# Patient Record
Sex: Male | Born: 1935 | Race: White | Hispanic: No | Smoking: Former smoker
Health system: Southern US, Community
[De-identification: ages and names within clinical notes are randomized; demographics above are authoritative.]

## PROBLEM LIST (undated history)

## (undated) DIAGNOSIS — E039 Hypothyroidism, unspecified: Secondary | ICD-10-CM

## (undated) DIAGNOSIS — G629 Polyneuropathy, unspecified: Secondary | ICD-10-CM

## (undated) DIAGNOSIS — I6529 Occlusion and stenosis of unspecified carotid artery: Secondary | ICD-10-CM

## (undated) DIAGNOSIS — Z972 Presence of dental prosthetic device (complete) (partial): Secondary | ICD-10-CM

## (undated) DIAGNOSIS — E785 Hyperlipidemia, unspecified: Secondary | ICD-10-CM

## (undated) DIAGNOSIS — E119 Type 2 diabetes mellitus without complications: Secondary | ICD-10-CM

## (undated) DIAGNOSIS — D649 Anemia, unspecified: Secondary | ICD-10-CM

## (undated) DIAGNOSIS — I10 Essential (primary) hypertension: Secondary | ICD-10-CM

## (undated) DIAGNOSIS — I809 Phlebitis and thrombophlebitis of unspecified site: Secondary | ICD-10-CM

## (undated) HISTORY — DX: Occlusion and stenosis of unspecified carotid artery: I65.29

## (undated) HISTORY — DX: Hyperlipidemia, unspecified: E78.5

## (undated) HISTORY — PX: NASAL FRACTURE SURGERY: SHX718

## (undated) HISTORY — DX: Type 2 diabetes mellitus without complications: E11.9

## (undated) HISTORY — DX: Anemia, unspecified: D64.9

## (undated) HISTORY — DX: Essential (primary) hypertension: I10

---

## 1996-04-08 DIAGNOSIS — I809 Phlebitis and thrombophlebitis of unspecified site: Secondary | ICD-10-CM

## 1996-04-08 HISTORY — DX: Phlebitis and thrombophlebitis of unspecified site: I80.9

## 1996-04-08 HISTORY — PX: CAROTID ENDARTERECTOMY: SUR193

## 2003-04-09 HISTORY — PX: SIGMOIDOSCOPY: SUR1295

## 2009-07-07 ENCOUNTER — Ambulatory Visit: Payer: Self-pay | Admitting: Internal Medicine

## 2009-07-10 ENCOUNTER — Ambulatory Visit: Payer: Self-pay | Admitting: Internal Medicine

## 2009-08-06 ENCOUNTER — Ambulatory Visit: Payer: Self-pay | Admitting: Internal Medicine

## 2009-09-06 ENCOUNTER — Ambulatory Visit: Payer: Self-pay | Admitting: Internal Medicine

## 2009-10-06 ENCOUNTER — Ambulatory Visit: Payer: Self-pay | Admitting: Internal Medicine

## 2009-10-30 ENCOUNTER — Ambulatory Visit: Payer: Self-pay | Admitting: Internal Medicine

## 2009-11-06 ENCOUNTER — Ambulatory Visit: Payer: Self-pay | Admitting: Internal Medicine

## 2009-12-07 ENCOUNTER — Ambulatory Visit: Payer: Self-pay | Admitting: Internal Medicine

## 2010-01-29 ENCOUNTER — Ambulatory Visit: Payer: Self-pay | Admitting: Internal Medicine

## 2010-02-06 ENCOUNTER — Ambulatory Visit: Payer: Self-pay | Admitting: Internal Medicine

## 2010-04-08 ENCOUNTER — Ambulatory Visit: Payer: Self-pay | Admitting: Internal Medicine

## 2010-04-30 ENCOUNTER — Ambulatory Visit: Payer: Self-pay | Admitting: Internal Medicine

## 2010-05-09 ENCOUNTER — Ambulatory Visit: Payer: Self-pay | Admitting: Internal Medicine

## 2010-07-30 ENCOUNTER — Ambulatory Visit: Payer: Self-pay | Admitting: Internal Medicine

## 2010-08-07 ENCOUNTER — Ambulatory Visit: Payer: Self-pay | Admitting: Internal Medicine

## 2010-10-29 ENCOUNTER — Ambulatory Visit: Payer: Self-pay | Admitting: Internal Medicine

## 2010-11-07 ENCOUNTER — Ambulatory Visit: Payer: Self-pay | Admitting: Internal Medicine

## 2011-01-30 ENCOUNTER — Ambulatory Visit: Payer: Self-pay | Admitting: Internal Medicine

## 2011-02-07 ENCOUNTER — Ambulatory Visit: Payer: Self-pay | Admitting: Internal Medicine

## 2011-05-02 ENCOUNTER — Ambulatory Visit: Payer: Self-pay | Admitting: Internal Medicine

## 2011-05-02 LAB — CBC CANCER CENTER
Basophil #: 0 x10 3/mm (ref 0.0–0.1)
Basophil %: 0.2 %
Eosinophil #: 0.1 x10 3/mm (ref 0.0–0.7)
HGB: 12.5 g/dL — ABNORMAL LOW (ref 13.0–18.0)
Lymphocyte #: 1.4 x10 3/mm (ref 1.0–3.6)
Lymphocyte %: 19.4 %
MCH: 27.9 pg (ref 26.0–34.0)
MCV: 84 fL (ref 80–100)
Monocyte #: 0.5 x10 3/mm (ref 0.0–0.7)
Monocyte %: 6.6 %
Neutrophil #: 5.2 x10 3/mm (ref 1.4–6.5)
Neutrophil %: 72.9 %
Platelet: 102 x10 3/mm — ABNORMAL LOW (ref 150–440)
RBC: 4.49 10*6/uL (ref 4.40–5.90)
RDW: 15.1 % — ABNORMAL HIGH (ref 11.5–14.5)
WBC: 7.1 x10 3/mm (ref 3.8–10.6)

## 2011-05-10 ENCOUNTER — Ambulatory Visit: Payer: Self-pay | Admitting: Internal Medicine

## 2011-07-31 ENCOUNTER — Ambulatory Visit: Payer: Self-pay | Admitting: Internal Medicine

## 2011-07-31 LAB — CBC CANCER CENTER
Basophil #: 0.1 x10 3/mm (ref 0.0–0.1)
Eosinophil #: 0.1 x10 3/mm (ref 0.0–0.7)
Eosinophil %: 1 %
HCT: 38.5 % — ABNORMAL LOW (ref 40.0–52.0)
HGB: 12.4 g/dL — ABNORMAL LOW (ref 13.0–18.0)
MCH: 27.5 pg (ref 26.0–34.0)
Monocyte #: 0.4 x10 3/mm (ref 0.2–1.0)
Monocyte %: 6.2 %
Neutrophil #: 3.8 x10 3/mm (ref 1.4–6.5)
Neutrophil %: 62.5 %
Platelet: 109 x10 3/mm — ABNORMAL LOW (ref 150–440)
RDW: 15.7 % — ABNORMAL HIGH (ref 11.5–14.5)
WBC: 6.1 x10 3/mm (ref 3.8–10.6)

## 2011-08-07 ENCOUNTER — Ambulatory Visit: Payer: Self-pay | Admitting: Internal Medicine

## 2011-10-30 ENCOUNTER — Ambulatory Visit: Payer: Self-pay | Admitting: Internal Medicine

## 2011-10-30 LAB — CBC CANCER CENTER
Basophil #: 0.1 x10 3/mm (ref 0.0–0.1)
Eosinophil #: 0.1 x10 3/mm (ref 0.0–0.7)
HCT: 37.7 % — ABNORMAL LOW (ref 40.0–52.0)
Lymphocyte %: 27.5 %
MCH: 27.6 pg (ref 26.0–34.0)
MCHC: 32.3 g/dL (ref 32.0–36.0)
MCV: 85 fL (ref 80–100)
Monocyte #: 0.4 x10 3/mm (ref 0.2–1.0)
Monocyte %: 6.1 %
Neutrophil #: 4.2 x10 3/mm (ref 1.4–6.5)
Neutrophil %: 64.5 %
RDW: 15.2 % — ABNORMAL HIGH (ref 11.5–14.5)

## 2011-11-07 ENCOUNTER — Ambulatory Visit: Payer: Self-pay | Admitting: Internal Medicine

## 2012-02-06 ENCOUNTER — Ambulatory Visit: Payer: Self-pay | Admitting: Internal Medicine

## 2012-02-06 LAB — CBC CANCER CENTER
Basophil #: 0.1 x10 3/mm (ref 0.0–0.1)
Eosinophil #: 0 x10 3/mm (ref 0.0–0.7)
HCT: 35.7 % — ABNORMAL LOW (ref 40.0–52.0)
HGB: 12.1 g/dL — ABNORMAL LOW (ref 13.0–18.0)
Lymphocyte #: 1.3 x10 3/mm (ref 1.0–3.6)
MCH: 28.1 pg (ref 26.0–34.0)
MCHC: 34 g/dL (ref 32.0–36.0)
Monocyte #: 0.5 x10 3/mm (ref 0.2–1.0)
Neutrophil #: 3.1 x10 3/mm (ref 1.4–6.5)
Platelet: 69 x10 3/mm — ABNORMAL LOW (ref 150–440)
RDW: 14.8 % — ABNORMAL HIGH (ref 11.5–14.5)
WBC: 5 x10 3/mm (ref 3.8–10.6)

## 2012-02-07 ENCOUNTER — Ambulatory Visit: Payer: Self-pay | Admitting: Internal Medicine

## 2012-02-19 LAB — CBC CANCER CENTER
Basophil #: 0.1 x10 3/mm (ref 0.0–0.1)
Basophil %: 1.2 %
Eosinophil %: 0.8 %
HGB: 12.9 g/dL — ABNORMAL LOW (ref 13.0–18.0)
Lymphocyte #: 1.7 x10 3/mm (ref 1.0–3.6)
Lymphocyte %: 24.4 %
MCV: 87 fL (ref 80–100)
Monocyte #: 0.3 x10 3/mm (ref 0.2–1.0)
Monocyte %: 5 %
Neutrophil #: 4.8 x10 3/mm (ref 1.4–6.5)
Neutrophil %: 68.6 %
Platelet: 150 x10 3/mm (ref 150–440)
RBC: 4.7 10*6/uL (ref 4.40–5.90)
WBC: 7 x10 3/mm (ref 3.8–10.6)

## 2012-03-08 ENCOUNTER — Ambulatory Visit: Payer: Self-pay | Admitting: Internal Medicine

## 2012-04-08 ENCOUNTER — Ambulatory Visit: Payer: Self-pay | Admitting: Internal Medicine

## 2012-05-07 LAB — CBC CANCER CENTER
Eosinophil #: 0.1 x10 3/mm (ref 0.0–0.7)
HGB: 12.5 g/dL — ABNORMAL LOW (ref 13.0–18.0)
Lymphocyte #: 1.6 x10 3/mm (ref 1.0–3.6)
MCH: 28.1 pg (ref 26.0–34.0)
MCHC: 32.9 g/dL (ref 32.0–36.0)
Monocyte #: 0.3 x10 3/mm (ref 0.2–1.0)
Monocyte %: 5.1 %
Neutrophil #: 4.1 x10 3/mm (ref 1.4–6.5)
Platelet: 85 x10 3/mm — ABNORMAL LOW (ref 150–440)
RDW: 15.4 % — ABNORMAL HIGH (ref 11.5–14.5)
WBC: 6.1 x10 3/mm (ref 3.8–10.6)

## 2012-05-09 ENCOUNTER — Ambulatory Visit: Payer: Self-pay | Admitting: Internal Medicine

## 2012-06-23 ENCOUNTER — Ambulatory Visit: Payer: Self-pay | Admitting: Internal Medicine

## 2012-08-06 ENCOUNTER — Ambulatory Visit: Payer: Self-pay | Admitting: Internal Medicine

## 2012-08-06 LAB — CBC CANCER CENTER
Basophil #: 0.1 x10 3/mm (ref 0.0–0.1)
Basophil %: 0.8 %
Eosinophil #: 0.1 x10 3/mm (ref 0.0–0.7)
Lymphocyte #: 1.9 x10 3/mm (ref 1.0–3.6)
Lymphocyte %: 25.2 %
MCH: 27 pg (ref 26.0–34.0)
MCV: 84 fL (ref 80–100)
Monocyte #: 0.2 x10 3/mm (ref 0.2–1.0)
Monocyte %: 3.2 %
Neutrophil %: 69.9 %
RDW: 15.4 % — ABNORMAL HIGH (ref 11.5–14.5)

## 2012-09-06 ENCOUNTER — Ambulatory Visit: Payer: Self-pay | Admitting: Internal Medicine

## 2012-09-30 ENCOUNTER — Encounter: Payer: Self-pay | Admitting: *Deleted

## 2012-11-11 ENCOUNTER — Ambulatory Visit: Payer: Self-pay | Admitting: Internal Medicine

## 2012-11-12 LAB — CBC CANCER CENTER
Basophil #: 0.1 x10 3/mm (ref 0.0–0.1)
Eosinophil #: 0.1 x10 3/mm (ref 0.0–0.7)
Eosinophil %: 2 %
HCT: 31.8 % — ABNORMAL LOW (ref 40.0–52.0)
Lymphocyte #: 1.7 x10 3/mm (ref 1.0–3.6)
Lymphocyte %: 26.4 %
MCH: 28.2 pg (ref 26.0–34.0)
MCHC: 33.7 g/dL (ref 32.0–36.0)
MCV: 84 fL (ref 80–100)
Monocyte #: 0.4 x10 3/mm (ref 0.2–1.0)
Monocyte %: 6.3 %
Neutrophil #: 4 x10 3/mm (ref 1.4–6.5)
Neutrophil %: 64.1 %
RBC: 3.79 10*6/uL — ABNORMAL LOW (ref 4.40–5.90)
RDW: 15.8 % — ABNORMAL HIGH (ref 11.5–14.5)
WBC: 6.3 x10 3/mm (ref 3.8–10.6)

## 2012-12-07 ENCOUNTER — Ambulatory Visit: Payer: Self-pay | Admitting: Internal Medicine

## 2013-03-17 ENCOUNTER — Encounter: Payer: Self-pay | Admitting: General Surgery

## 2013-03-17 ENCOUNTER — Other Ambulatory Visit: Payer: Medicare Other

## 2013-03-17 ENCOUNTER — Ambulatory Visit: Payer: Self-pay | Admitting: General Surgery

## 2013-03-17 ENCOUNTER — Ambulatory Visit (INDEPENDENT_AMBULATORY_CARE_PROVIDER_SITE_OTHER): Payer: Medicare Other | Admitting: General Surgery

## 2013-03-17 VITALS — BP 130/60 | HR 70 | Resp 14 | Ht 69.0 in | Wt 162.0 lb

## 2013-03-17 DIAGNOSIS — I6529 Occlusion and stenosis of unspecified carotid artery: Secondary | ICD-10-CM

## 2013-03-17 NOTE — Patient Instructions (Signed)
Call for any symptoms of dizziness, sudden weakness or numbness or other symptoms suggestive of stroke or TIAs.-Pt aware opf these symptoms.

## 2013-03-17 NOTE — Progress Notes (Signed)
This is a 79 year old old male here today for a carotid ultrasound. Has history of a right endarterectomy . No neurological symptoms in past year. No new health issues. Doppler done today showing no changes.    Bilateral carotid Doppler study was performed today. The bifurcation was fairly high on both sides. The right external carotid was not well seen all. Only the proximal portions of the internal carotid seen bilaterally and good portion of the left external carotid. The endarterectomy site on the right appears to be clean with no sign of recurrent disease. There is mild intimal thickening noted on both sides. No plaque is identified on the left carotid the bifurcation. The vertebral arteries were difficult to visualize on either side. ICA to CCA ratio is 0.76 on the right and 0.46 on the left. Impression: Normal study with no evidence of recurrent disease in the right carotid.

## 2013-03-17 NOTE — Addendum Note (Signed)
Addended by: Volney Presser on: 03/17/2013 03:54 PM   Modules accepted: Orders

## 2014-03-17 ENCOUNTER — Ambulatory Visit (INDEPENDENT_AMBULATORY_CARE_PROVIDER_SITE_OTHER): Payer: Medicare Other | Admitting: General Surgery

## 2014-03-17 ENCOUNTER — Encounter: Payer: Self-pay | Admitting: General Surgery

## 2014-03-17 ENCOUNTER — Ambulatory Visit: Payer: Medicare Other

## 2014-03-17 VITALS — BP 130/70 | HR 70 | Resp 14 | Ht 69.0 in | Wt 168.0 lb

## 2014-03-17 DIAGNOSIS — I6529 Occlusion and stenosis of unspecified carotid artery: Secondary | ICD-10-CM

## 2014-03-17 NOTE — Progress Notes (Signed)
Patient ID: Corey Evans, male   DOB: 08/22/1927, 80 y.o.   MRN: [id number]   The patient presents for a 1 year follow up for a carotid doppler. He is doing well. No new complaints at this time.  He is alert and oriented. No focal neuro signs. Attempted Duplex study. Bifurcation is high and it was difficult to see the ICA and ECA on both sides. This is inadequate study.  Recommended  A CTA. Pt agreeable.  Patient has been scheduled for a [facility] Ne[facility] for Monday, 03-21-14 at 11 am (arrive 10:45 am). This patient has been instructed to hold Metformin day of scan and 48 hours after.

## 2014-03-17 NOTE — Patient Instructions (Addendum)
The patient is aware to call back for any questions or concerns.  Patient has been scheduled for a CT[facility] Neck WWO contrast [facility] for Monday, 03-21-14 at 11 am (arrive 10:45 am). This patient has been instructed to hold Metformin day of scan and 48 hours after.

## 2014-03-21 ENCOUNTER — Encounter: Payer: Self-pay | Admitting: General Surgery

## 2014-03-21 ENCOUNTER — Ambulatory Visit: Payer: Self-pay | Admitting: General Surgery

## 2015-03-21 ENCOUNTER — Encounter: Payer: Self-pay | Admitting: General Surgery

## 2015-03-21 ENCOUNTER — Ambulatory Visit: Payer: Medicare Other | Admitting: General Surgery

## 2015-03-21 ENCOUNTER — Other Ambulatory Visit (INDEPENDENT_AMBULATORY_CARE_PROVIDER_SITE_OTHER): Payer: Medicare Other

## 2015-03-21 VITALS — BP 142/64 | HR 84 | Resp 16 | Ht 69.0 in | Wt 172.0 lb

## 2015-03-21 DIAGNOSIS — I6529 Occlusion and stenosis of unspecified carotid artery: Secondary | ICD-10-CM | POA: Diagnosis not present

## 2015-03-21 NOTE — Patient Instructions (Addendum)
The patient is aware to call back for any questions or concerns. Follow up in one year with an office carotid ultrasound.

## 2015-03-21 NOTE — Progress Notes (Signed)
The patient presents for a 1 year follow up for a carotid doppler. He is doing well. No new complaints at this time.  He is alert and oriented. He is here today with his daughter. No focal neuro signs. I have reviewed the history of present illness with the patient.  Carotid doppler was completed today. Again he has very high bifurcation on both sides. Only the beginning if ICA is seen well on right and left side. On right no plaquing is identified.Left side with moderate plaquing in CCA,Bulb and proximal ICA. No apparent stenosis noted. CTA was done last yr showwing clean vessels on right and plaque with no stenosis on left. Current findings appear stable.     Follow up in one year with an office carotid ultrasound.   PCP:  Yates DecampWalker, Corey

## 2015-03-24 ENCOUNTER — Encounter: Payer: Self-pay | Admitting: General Surgery

## 2016-03-26 ENCOUNTER — Ambulatory Visit: Payer: Medicare Other | Admitting: General Surgery

## 2016-03-26 ENCOUNTER — Encounter: Payer: Self-pay | Admitting: *Deleted

## 2016-04-03 ENCOUNTER — Ambulatory Visit (INDEPENDENT_AMBULATORY_CARE_PROVIDER_SITE_OTHER): Payer: Medicare Other | Admitting: General Surgery

## 2016-04-03 ENCOUNTER — Other Ambulatory Visit: Payer: Self-pay

## 2016-04-03 ENCOUNTER — Encounter: Payer: Self-pay | Admitting: General Surgery

## 2016-04-03 VITALS — BP 154/70 | HR 82 | Resp 14 | Ht 69.0 in | Wt 171.0 lb

## 2016-04-03 DIAGNOSIS — I6523 Occlusion and stenosis of bilateral carotid arteries: Secondary | ICD-10-CM

## 2016-04-03 NOTE — Patient Instructions (Signed)
The patient is aware to call back for any questions or concerns.  

## 2016-04-03 NOTE — Progress Notes (Signed)
Patient ID: Corey Evans, male   DOB: 07/01/1927, 82 y.o.   MRN: [id number]  Chief Complaint  Patient presents with  . Follow-up    HPI Corey SergeWilliam Gerald Farruggia is a 82 y.o. male. presents for a 1 year follow up for a carotid doppler. He is doing well. No new complaints at this time.  He is alert and oriented. Denis any symptoms or lightheadednes. He is complaining of right knee pain followed by  Dr Dan HumphreysWalker.   I have reviewed the history of present illness with the patient.  HPI  Past Medical History:  Diagnosis Date  . Anemia   . Diabetes mellitus without complication (HCC)   . Hyperlipidemia   . Hypertension   . Occlusion and stenosis of carotid artery without mention of cerebral infarction     Past Surgical History:  Procedure Laterality Date  . CAROTID ENDARTERECTOMY Right 1998  . NASAL FRACTURE SURGERY  [id number]  . SIGMOIDOSCOPY  2005    No family history on file.  Social History Social History  Substance Use Topics  . Smoking status: Former Smoker    Packs/day: 2.00    Years: 30.00  . Smokeless tobacco: Never Used  . Alcohol use No    No Known Allergies  Current Outpatient Prescriptions  Medication Sig Dispense Refill  . amLODipine (NORVASC) 5 MG tablet Take 5 mg by mouth daily.    Marland Kitchen. aspirin 81 MG tablet Take 81 mg by mouth daily.    . cholecalciferol (VITAMIN D) 400 UNITS TABS tablet Take 400 Units by mouth.    Marland Kitchen. glimepiride (AMARYL) 4 MG tablet Take 1 tablet by mouth daily.    Marland Kitchen. losartan (COZAAR) 50 MG tablet Take 50 mg by mouth daily.    . metFORMIN (GLUCOPHAGE) 500 MG tablet Take by mouth 2 (two) times daily with a meal.    . metoprolol succinate (TOPROL-XL) 25 MG 24 hr tablet Take 1 tablet by mouth daily.    . Multiple Vitamin (MULTIVITAMIN) tablet Take 1 tablet by mouth daily.    . Omega-3 Fatty Acids (FISH OIL PO) Take 1 capsule by mouth daily.    . pioglitazone (ACTOS) 30 MG tablet Take 30 mg by mouth daily.     No current  facility-administered medications for this visit.     Review of Systems Review of Systems  Constitutional: Negative.   Respiratory: Negative.   Cardiovascular: Negative.     Blood pressure (!) 154/70, pulse 82, resp. rate 14, height 5\' 9"  (1.753 m), weight 171 lb (77.6 kg).  Physical Exam Physical Exam  Constitutional: He is oriented to person, place, and time. He appears well-developed and well-nourished.  HENT:  Mouth/Throat: Oropharynx is clear and moist.  Cardiovascular: Normal rate, regular rhythm and normal heart sounds.   Neurological: He is alert and oriented to person, place, and time.  Skin: Skin is warm and dry.  Psychiatric: His behavior is normal.    Data Reviewed prior notes Duplex carotid study completed today. As noted before he has very high bifurcations limiting evaluation of ICA and ECA. The right side remains clean post endarterectomy. Minimal plaquing noted in left CCA and bifurcation area with no apparent stenosis Assessment    Stable mild plaquing on left carotid without any stenosis. CTA was done 2015 showing clean vessels on right and plaque with no stenosis on left.     Plan   He has remained stable for several yrs now and given his age will not schedule routine yrly  carotid doppler.  He is aware of symptoms of TIA/stroke like events. Follow up as needed. The patient is aware to call back for any questions or concerns.     This information has been scribed by Dorathy DaftMarsha Hatch RN, BSN,BC.    Sullivan Jacuinde G 04/05/2016, 9:30 AM

## 2016-05-30 ENCOUNTER — Other Ambulatory Visit
Admission: RE | Admit: 2016-05-30 | Discharge: 2016-05-30 | Disposition: A | Discharge status: Home / Self Care | Payer: Medicare Other | Source: Ambulatory Visit | Attending: Ophthalmology | Admitting: Ophthalmology

## 2016-05-30 DIAGNOSIS — H47012 Ischemic optic neuropathy, left eye: Secondary | ICD-10-CM | POA: Insufficient documentation

## 2016-05-30 LAB — C-REACTIVE PROTEIN: CRP: <0.8 mg/dL (ref <1.0)

## 2016-05-30 LAB — PLATELET COUNT: PLATELETS: 113 10*3/uL — AB (ref 150–440)

## 2016-05-30 LAB — SEDIMENTATION RATE: SED RATE: 13 mm/h (ref 0–20)

## 2016-08-07 ENCOUNTER — Encounter: Payer: Self-pay | Admitting: *Deleted

## 2016-08-08 NOTE — Discharge Instructions (Signed)
Cataract Surgery, Care After °Refer to this sheet in the next few weeks. These instructions provide you with information about caring for yourself after your procedure. Your health care provider may also give you more specific instructions. Your treatment has been planned according to current medical practices, but problems sometimes occur. Call your health care provider if you have any problems or questions after your procedure. °What can I expect after the procedure? °After the procedure, it is common to have: °· Itching. °· Discomfort. °· Fluid discharge. °· Sensitivity to light and to touch. °· Bruising. °Follow these instructions at home: °Eye Care  °· Check your eye every day for signs of infection. Watch for: °¨ Redness, swelling, or pain. °¨ Fluid, blood, or pus. °¨ Warmth. °¨ Bad smell. °Activity  °· Avoid strenuous activities, such as playing contact sports, for as long as told by your health care provider. °· Do not drive or operate heavy machinery until your health care provider approves. °· Do not bend or lift heavy objects . Bending increases pressure in the eye. You can walk, climb stairs, and do light household chores. °· Ask your health care provider when you can return to work. If you work in a dusty environment, you may be advised to wear protective eyewear for a period of time. °General instructions  °· Take or apply over-the-counter and prescription medicines only as told by your health care provider. This includes eye drops. °· Do not touch or rub your eyes. °· If you were given a protective shield, wear it as told by your health care provider. If you were not given a protective shield, wear sunglasses as told by your health care provider to protect your eyes. °· Keep the area around your eye clean and dry. Avoid swimming or allowing water to hit you directly in the face while showering until told by your health care provider. Keep soap and shampoo out of your eyes. °· Do not put a contact lens  into the affected eye or eyes until your health care provider approves. °· Keep all follow-up visits as told by your health care provider. This is important. °Contact a health care provider if: ° °· You have increased bruising around your eye. °· You have pain that is not helped with medicine. °· You have a fever. °· You have redness, swelling, or pain in your eye. °· You have fluid, blood, or pus coming from your incision. °· Your vision gets worse. °Get help right away if: °· You have sudden vision loss. °This information is not intended to replace advice given to you by your health care provider. Make sure you discuss any questions you have with your health care provider. °Document Released: 10/12/2004 Document Revised: 08/03/2015 Document Reviewed: 02/02/2015 °Elsevier Interactive Patient Education © 2017 [employer] ° ° ° ° °General Anesthesia, Adult, Care After °These instructions provide you with information about caring for yourself after your procedure. Your health care provider may also give you more specific instructions. Your treatment has been planned according to current medical practices, but problems sometimes occur. Call your health care provider if you have any problems or questions after your procedure. °What can I expect after the procedure? °After the procedure, it is common to have: °· Vomiting. °· A sore throat. °· Mental slowness. °It is common to feel: °· Nauseous. °· Cold or shivery. °· Sleepy. °· Tired. °· Sore or achy, even in parts of your body where you did not have surgery. °Follow these instructions at   home: °For at least 24 hours after the procedure:  °· Do not: °¨ Participate in activities where you could fall or become injured. °¨ Drive. °¨ Use heavy machinery. °¨ Drink alcohol. °¨ Take sleeping pills or medicines that cause drowsiness. °¨ Make important decisions or sign legal documents. °¨ Take care of children on your own. °· Rest. °Eating and drinking  °· If you vomit, drink  water, juice, or soup when you can drink without vomiting. °· Drink enough fluid to keep your urine clear or pale yellow. °· Make sure you have little or no nausea before eating solid foods. °· Follow the diet recommended by your health care provider. °General instructions  °· Have a responsible adult stay with you until you are awake and alert. °· Return to your normal activities as told by your health care provider. Ask your health care provider what activities are safe for you. °· Take over-the-counter and prescription medicines only as told by your health care provider. °· If you smoke, do not smoke without supervision. °· Keep all follow-up visits as told by your health care provider. This is important. °Contact a health care provider if: °· You continue to have nausea or vomiting at home, and medicines are not helpful. °· You cannot drink fluids or start eating again. °· You cannot urinate after 8-12 hours. °· You develop a skin rash. °· You have fever. °· You have increasing redness at the site of your procedure. °Get help right away if: °· You have difficulty breathing. °· You have chest pain. °· You have unexpected bleeding. °· You feel that you are having a life-threatening or urgent problem. °This information is not intended to replace advice given to you by your health care provider. Make sure you discuss any questions you have with your health care provider. °Document Released: 07/01/2000 Document Revised: 08/28/2015 Document Reviewed: 03/09/2015 °Elsevier Interactive Patient Education © 2017 [employer] Inc[employer] ° °

## 2016-08-14 ENCOUNTER — Ambulatory Visit
Admission: RE | Admit: 2016-08-14 | Discharge: 2016-08-14 | Disposition: A | Discharge status: Home / Self Care | Payer: Medicare Other | Source: Ambulatory Visit | Attending: Ophthalmology | Admitting: Ophthalmology

## 2016-08-14 ENCOUNTER — Ambulatory Visit: Payer: Medicare Other | Admitting: Anesthesiology

## 2016-08-14 ENCOUNTER — Encounter
Admission: RE | Disposition: A | Discharge status: Home / Self Care | Payer: Self-pay | Source: Ambulatory Visit | Attending: Ophthalmology

## 2016-08-14 DIAGNOSIS — Z87891 Personal history of nicotine dependence: Secondary | ICD-10-CM | POA: Insufficient documentation

## 2016-08-14 DIAGNOSIS — E1136 Type 2 diabetes mellitus with diabetic cataract: Secondary | ICD-10-CM | POA: Diagnosis present

## 2016-08-14 DIAGNOSIS — I739 Peripheral vascular disease, unspecified: Secondary | ICD-10-CM | POA: Diagnosis not present

## 2016-08-14 DIAGNOSIS — I10 Essential (primary) hypertension: Secondary | ICD-10-CM | POA: Diagnosis not present

## 2016-08-14 DIAGNOSIS — H5703 Miosis: Secondary | ICD-10-CM | POA: Diagnosis not present

## 2016-08-14 HISTORY — DX: Phlebitis and thrombophlebitis of unspecified site: I80.9

## 2016-08-14 HISTORY — PX: CATARACT EXTRACTION W/PHACO: SHX586

## 2016-08-14 HISTORY — DX: Presence of dental prosthetic device (complete) (partial): Z97.2

## 2016-08-14 HISTORY — DX: Polyneuropathy, unspecified: G62.9

## 2016-08-14 LAB — GLUCOSE, CAPILLARY
GLUCOSE-CAPILLARY: 158 mg/dL — AB (ref 65–99)
Glucose-Capillary: 148 mg/dL — ABNORMAL HIGH (ref 65–99)

## 2016-08-14 SURGERY — PHACOEMULSIFICATION, CATARACT, WITH IOL INSERTION
Anesthesia: Monitor Anesthesia Care | Site: Eye | Laterality: Right | Wound class: Clean

## 2016-08-14 MED ORDER — FENTANYL CITRATE (PF) 100 MCG/2ML IJ SOLN
INTRAMUSCULAR | Status: DC | PRN
  Administered 2016-08-14: 50 ug via INTRAVENOUS

## 2016-08-14 MED ORDER — ARMC OPHTHALMIC DILATING DROPS
1.0000 "application " | OPHTHALMIC | Status: DC | PRN
  Administered 2016-08-14 (×3): 1 via OPHTHALMIC

## 2016-08-14 MED ORDER — LIDOCAINE HCL (PF) 2 % IJ SOLN
INTRAOCULAR | Status: DC | PRN
  Administered 2016-08-14: 1 mL via INTRAOCULAR

## 2016-08-14 MED ORDER — EPINEPHRINE PF 1 MG/ML IJ SOLN
INTRAOCULAR | Status: DC | PRN
  Administered 2016-08-14: 135 mL via OPHTHALMIC

## 2016-08-14 MED ORDER — MIDAZOLAM HCL 2 MG/2ML IJ SOLN
INTRAMUSCULAR | Status: DC | PRN
  Administered 2016-08-14: 1.5 mg via INTRAVENOUS

## 2016-08-14 MED ORDER — ACETAMINOPHEN 160 MG/5ML PO SOLN: 325.0000 mg | ORAL | Status: DC | PRN

## 2016-08-14 MED ORDER — BRIMONIDINE TARTRATE-TIMOLOL 0.2-0.5 % OP SOLN
OPHTHALMIC | Status: DC | PRN
  Administered 2016-08-14: 1 [drp] via OPHTHALMIC

## 2016-08-14 MED ORDER — MOXIFLOXACIN HCL 0.5 % OP SOLN
1.0000 [drp] | OPHTHALMIC | Status: DC | PRN
  Administered 2016-08-14 (×3): 1 [drp] via OPHTHALMIC

## 2016-08-14 MED ORDER — NA HYALUR & NA CHOND-NA HYALUR 0.4-0.35 ML IO KIT
PACK | INTRAOCULAR | Status: DC | PRN
  Administered 2016-08-14: 1 mL via INTRAOCULAR

## 2016-08-14 MED ORDER — CEFUROXIME OPHTHALMIC INJECTION 1 MG/0.1 ML
INJECTION | OPHTHALMIC | Status: DC | PRN
  Administered 2016-08-14: 0.1 mL via OPHTHALMIC

## 2016-08-14 MED ORDER — LACTATED RINGERS IV SOLN: INTRAVENOUS | Status: DC

## 2016-08-14 MED ORDER — ACETAMINOPHEN 325 MG PO TABS: 325.0000 mg | ORAL_TABLET | ORAL | Status: DC | PRN

## 2016-08-14 MED ORDER — ERYTHROMYCIN 5 MG/GM OP OINT
TOPICAL_OINTMENT | OPHTHALMIC | Status: DC | PRN
  Administered 2016-08-14: 1 via OPHTHALMIC

## 2016-08-14 SURGICAL SUPPLY — 25 items
CANNULA ANT/CHMB 27GA (MISCELLANEOUS) ×3 IMPLANT
CARTRIDGE ABBOTT (MISCELLANEOUS) IMPLANT
GLOVE SURG LX 7.5 STRW (GLOVE) ×4
GLOVE SURG LX STRL 7.5 STRW (GLOVE) ×2 IMPLANT
GLOVE SURG TRIUMPH 8.0 PF LTX (GLOVE) ×3 IMPLANT
GOWN STRL REUS W/ TWL LRG LVL3 (GOWN DISPOSABLE) ×2 IMPLANT
GOWN STRL REUS W/TWL LRG LVL3 (GOWN DISPOSABLE) ×4
LENS IOL TECNIS ITEC 19.0 (Intraocular Lens) ×3 IMPLANT
MARKER SKIN DUAL TIP RULER LAB (MISCELLANEOUS) ×3 IMPLANT
NDL RETROBULBAR .5 NSTRL (NEEDLE) IMPLANT
NEEDLE FILTER BLUNT 18X 1/2SAF (NEEDLE) ×4
NEEDLE FILTER BLUNT 18X1 1/2 (NEEDLE) ×2 IMPLANT
PACK CATARACT BRASINGTON (MISCELLANEOUS) ×3 IMPLANT
PACK EYE AFTER SURG (MISCELLANEOUS) ×3 IMPLANT
PACK OPTHALMIC (MISCELLANEOUS) ×3 IMPLANT
RING MALYGIN 7.0 (MISCELLANEOUS) ×3 IMPLANT
SUT ETHILON 10-0 CS-B-6CS-B-6 (SUTURE)
SUT VICRYL  9 0 (SUTURE)
SUT VICRYL 9 0 (SUTURE) IMPLANT
SUTURE EHLN 10-0 CS-B-6CS-B-6 (SUTURE) IMPLANT
SYR 3ML LL SCALE MARK (SYRINGE) ×6 IMPLANT
SYR 5ML LL (SYRINGE) ×3 IMPLANT
SYR TB 1ML LUER SLIP (SYRINGE) ×3 IMPLANT
WATER STERILE IRR 250ML POUR (IV SOLUTION) ×3 IMPLANT
WIPE NON LINTING 3.25X3.25 (MISCELLANEOUS) ×3 IMPLANT

## 2016-08-14 NOTE — Anesthesia Preprocedure Evaluation (Signed)
Anesthesia Evaluation  Patient identified by MRN, date of birth, ID band Patient awake    Reviewed: Allergy & Precautions, H&P , NPO status , Patient's Chart, lab work & pertinent test results, reviewed documented beta blocker date and time   Airway Mallampati: III  TM Distance: >3 FB Neck ROM: full    Dental  (+) Upper Dentures, Lower Dentures   Pulmonary neg pulmonary ROS, former smoker,    Pulmonary exam normal breath sounds clear to auscultation       Cardiovascular Exercise Tolerance: Good hypertension, + Peripheral Vascular Disease  negative cardio ROS Normal cardiovascular exam Rhythm:regular Rate:Normal     Neuro/Psych negative neurological ROS  negative psych ROS   GI/Hepatic negative GI ROS, Neg liver ROS,   Endo/Other  negative endocrine ROSdiabetes  Renal/GU negative Renal ROS  negative genitourinary   Musculoskeletal   Abdominal   Peds  Hematology negative hematology ROS (+)   Anesthesia Other Findings   Reproductive/Obstetrics negative OB ROS                             Anesthesia Physical Anesthesia Plan  ASA: III  Anesthesia Plan: MAC   Post-op Pain Management:    Induction:   Airway Management Planned:   Additional Equipment:   Intra-op Plan:   Post-operative Plan:   Informed Consent: I have reviewed the patients History and Physical, chart, labs and discussed the procedure including the risks, benefits and alternatives for the proposed anesthesia with the patient or authorized representative who has indicated his/her understanding and acceptance.   Dental Advisory Given  Plan Discussed with: CRNA and Anesthesiologist  Anesthesia Plan Comments:         Anesthesia Quick Evaluation

## 2016-08-14 NOTE — Op Note (Signed)
OPERATIVE NOTE  Corey Evans [id number] 08/14/2016   PREOIsaiah SergeERATIVE DIAGNOSIS:    Nuclear Sclerotic Cataract Right eye with miotic pupil.        H25.11  POSTOPERATIVE DIAGNOSIS: Nuclear Sclerotic Cataract Right eye with miotic pupil.          PROCEDURE:  Phacoemusification with posterior chamber intraocular lens placement of the right eye which required pupil stretching with the Malyugin pupil expansion device.  LENS:   Implant Name Type Inv. Item Serial No. Manufacturer Lot No. LRB No. Used  LENS IOL DIOP 19.0 - [id number] Intraocular Lens LENS IOL DIOP 19.0 [id number] AMO   Right 1       ULTRASOUND TIME: 21 % of 3 minutes 25 seconds, CDE 43.6  SURGEON:  Deirdre Evenerhadwick R. Leelynd Maldonado, MD   ANESTHESIA:  Topical with tetracaine drops and 2% Xylocaine jelly, augmented with 1% preservative-free intracameral lidocaine.   COMPLICATIONS:  None.   DESCRIPTION OF PROCEDURE:  The patient was identified in the holding room and transported to the operating room and placed in the supine position under the operating microscope. Theright eye was identified as the operative eye and it was prepped and draped in the usual sterile ophthalmic fashion.   A 1 millimeter clear-corneal paracentesis was made at the 12:00 position.  0.5 ml of preservative-free 1% lidocaine was injected into the anterior chamber. The anterior chamber was filled with Viscoat viscoelastic.  A 2.4 millimeter keratome was used to make a near-clear corneal incision at the 9:00 position. A Malyugin pupil expander was then placed through the main incision and into the anterior chamber of the eye.  The edge of the iris was secured on the lip of the pupil expander and it was released, thereby expanding the pupil to approximately 8 millimeters for completion of the cataract surgery.  Additional Viscoat was placed in the anterior chamber.  A cystotome and capsulorrhexis forceps were used to make a curvilinear capsulorrhexis.   Balanced salt  solution was used to hydrodissect and hydrodelineate the lens nucleus.   Phacoemulsification was used in stop and chop fashion to remove the lens, nucleus and epinucleus.  The remaining cortex was aspirated using the irrigation aspiration handpiece.  Additional Provisc was placed into the eye to distend the capsular bag for lens placement.  A lens was then injected into the capsular bag.  The pupil expanding ring was removed using a Kuglen hook and insertion device. The remaining viscoelastic was aspirated from the capsular bag and the anterior chamber.  The anterior chamber was filled with balanced salt solution to inflate to a physiologic pressure.  Wounds were hydrated with balanced salt solution.  The anterior chamber was inflated to a physiologic pressure with balanced salt solution.  No wound leaks were noted.Cefuroxime 0.1 ml of a 10mg /ml solution was injected into the anterior chamber for a dose of 1 mg of intracameral antibiotic at the completion of the case. Timolol and Brimonidine drops and erythromycin ointment were applied to the eye.  The patient was taken to the recovery room in stable condition without complications of anesthesia or surgery.  Aerion Bagdasarian 08/14/2016, 8:47 AM

## 2016-08-14 NOTE — H&P (Signed)
The History and Physical notes are on paper, have been signed, and are to be scanned. The patient remains stable and unchanged from the H&P.   Previous H&P reviewed, patient examined, and there are no changes.  Corey Evans 08/14/2016 7:45 AM   

## 2016-08-14 NOTE — Anesthesia Procedure Notes (Signed)
Procedure Name: MAC Performed by: Mayme Genta Pre-anesthesia Checklist: Patient identified, Emergency Drugs available, Suction available, Timeout performed and Patient being monitored Patient Re-evaluated:Patient Re-evaluated prior to inductionOxygen Delivery Method: Nasal cannula Placement Confirmation: positive ETCO2

## 2016-08-14 NOTE — Transfer of Care (Signed)
Immediate Anesthesia Transfer of Care Note  Patient: Corey Evans  Procedure(s) Performed: Procedure(s) with comments: CATARACT EXTRACTION PHACO AND INTRAOCULAR LENS PLACEMENT (IOC)  Complicated Right Diabetic (Right) - Diabetic - oral meds Malyugin  Patient Location: PACU  Anesthesia Type: MAC  Level of Consciousness: awake, alert  and patient cooperative  Airway and Oxygen Therapy: Patient Spontanous Breathing and Patient connected to supplemental oxygen  Post-op Assessment: Post-op Vital signs reviewed, Patient's Cardiovascular Status Stable, Respiratory Function Stable, Patent Airway and No signs of Nausea or vomiting  Post-op Vital Signs: Reviewed and stable  Complications: No apparent anesthesia complications

## 2016-08-14 NOTE — Anesthesia Postprocedure Evaluation (Signed)
Anesthesia Post Note  Patient: Corey Evans  Procedure(s) Performed: Procedure(s) (LRB): CATARACT EXTRACTION PHACO AND INTRAOCULAR LENS PLACEMENT (IOC)  Complicated Right Diabetic (Right)  Patient location during evaluation: PACU Anesthesia Type: MAC Level of consciousness: awake and alert Pain management: pain level controlled Vital Signs Assessment: post-procedure vital signs reviewed and stable Respiratory status: spontaneous breathing, nonlabored ventilation, respiratory function stable and patient connected to nasal cannula oxygen Cardiovascular status: stable and blood pressure returned to baseline Anesthetic complications: no    Trecia Rogers

## 2016-08-15 ENCOUNTER — Encounter: Payer: Self-pay | Admitting: Ophthalmology

## 2017-03-12 ENCOUNTER — Encounter: Payer: Self-pay | Admitting: *Deleted

## 2017-03-12 ENCOUNTER — Other Ambulatory Visit: Payer: Self-pay

## 2017-03-19 NOTE — Discharge Instructions (Signed)

## 2017-03-24 ENCOUNTER — Ambulatory Visit: Payer: Medicare Other | Admitting: Anesthesiology

## 2017-03-24 ENCOUNTER — Encounter
Admission: RE | Disposition: A | Discharge status: Home / Self Care | Payer: Self-pay | Source: Ambulatory Visit | Attending: Ophthalmology

## 2017-03-24 ENCOUNTER — Ambulatory Visit
Admission: RE | Admit: 2017-03-24 | Discharge: 2017-03-24 | Disposition: A | Discharge status: Home / Self Care | Payer: Medicare Other | Source: Ambulatory Visit | Attending: Ophthalmology | Admitting: Ophthalmology

## 2017-03-24 DIAGNOSIS — E1136 Type 2 diabetes mellitus with diabetic cataract: Secondary | ICD-10-CM | POA: Diagnosis present

## 2017-03-24 DIAGNOSIS — Z87891 Personal history of nicotine dependence: Secondary | ICD-10-CM | POA: Insufficient documentation

## 2017-03-24 DIAGNOSIS — D649 Anemia, unspecified: Secondary | ICD-10-CM | POA: Diagnosis not present

## 2017-03-24 DIAGNOSIS — I10 Essential (primary) hypertension: Secondary | ICD-10-CM | POA: Insufficient documentation

## 2017-03-24 DIAGNOSIS — N182 Chronic kidney disease, stage 2 (mild): Secondary | ICD-10-CM | POA: Diagnosis not present

## 2017-03-24 DIAGNOSIS — I739 Peripheral vascular disease, unspecified: Secondary | ICD-10-CM | POA: Diagnosis not present

## 2017-03-24 DIAGNOSIS — K219 Gastro-esophageal reflux disease without esophagitis: Secondary | ICD-10-CM | POA: Insufficient documentation

## 2017-03-24 HISTORY — DX: Hypothyroidism, unspecified: E03.9

## 2017-03-24 HISTORY — PX: CATARACT EXTRACTION W/PHACO: SHX586

## 2017-03-24 LAB — GLUCOSE, CAPILLARY
Glucose-Capillary: 81 mg/dL (ref 65–99)
Glucose-Capillary: 84 mg/dL (ref 65–99)

## 2017-03-24 SURGERY — PHACOEMULSIFICATION, CATARACT, WITH IOL INSERTION
Anesthesia: Topical | Laterality: Left | Wound class: Clean

## 2017-03-24 MED ORDER — BRIMONIDINE TARTRATE-TIMOLOL 0.2-0.5 % OP SOLN
OPHTHALMIC | Status: DC | PRN
  Administered 2017-03-24: 1 [drp] via OPHTHALMIC

## 2017-03-24 MED ORDER — LACTATED RINGERS IV SOLN: INTRAVENOUS | Status: DC

## 2017-03-24 MED ORDER — ALFENTANIL 500 MCG/ML IJ INJ
INJECTION | INTRAVENOUS | Status: DC | PRN
  Administered 2017-03-24 (×2): 250 ug via INTRAVENOUS

## 2017-03-24 MED ORDER — EPINEPHRINE PF 1 MG/ML IJ SOLN
INTRAMUSCULAR | Status: DC | PRN
  Administered 2017-03-24: 85 mL via OPHTHALMIC

## 2017-03-24 MED ORDER — MOXIFLOXACIN HCL 0.5 % OP SOLN
1.0000 [drp] | OPHTHALMIC | Status: DC | PRN
  Administered 2017-03-24 (×3): 1 [drp] via OPHTHALMIC

## 2017-03-24 MED ORDER — BALANCED SALT IO SOLN
INTRAOCULAR | Status: DC | PRN
  Administered 2017-03-24: 1 mL via INTRAMUSCULAR

## 2017-03-24 MED ORDER — CEFUROXIME OPHTHALMIC INJECTION 1 MG/0.1 ML
INJECTION | OPHTHALMIC | Status: DC | PRN
  Administered 2017-03-24: 0.1 mL via INTRACAMERAL

## 2017-03-24 MED ORDER — OXYCODONE HCL 5 MG PO TABS: 5.0000 mg | ORAL_TABLET | Freq: Once | ORAL | Status: DC | PRN

## 2017-03-24 MED ORDER — ARMC OPHTHALMIC DILATING DROPS
1.0000 "application " | OPHTHALMIC | Status: DC | PRN
  Administered 2017-03-24 (×3): 1 via OPHTHALMIC

## 2017-03-24 MED ORDER — MIDAZOLAM HCL 2 MG/2ML IJ SOLN
INTRAMUSCULAR | Status: DC | PRN
  Administered 2017-03-24: 1 mg via INTRAVENOUS

## 2017-03-24 MED ORDER — OXYCODONE HCL 5 MG/5ML PO SOLN: 5.0000 mg | Freq: Once | ORAL | Status: DC | PRN

## 2017-03-24 MED ORDER — LACTATED RINGERS IV SOLN: 500.0000 mL | INTRAVENOUS | Status: DC

## 2017-03-24 MED ORDER — NA HYALUR & NA CHOND-NA HYALUR 0.4-0.35 ML IO KIT
PACK | INTRAOCULAR | Status: DC | PRN
  Administered 2017-03-24: 1 mL via INTRAOCULAR

## 2017-03-24 SURGICAL SUPPLY — 25 items
CANNULA ANT/CHMB 27GA (MISCELLANEOUS) ×3 IMPLANT
CARTRIDGE ABBOTT (MISCELLANEOUS) IMPLANT
GLOVE SURG LX 7.5 STRW (GLOVE) ×2
GLOVE SURG LX STRL 7.5 STRW (GLOVE) ×1 IMPLANT
GLOVE SURG TRIUMPH 8.0 PF LTX (GLOVE) ×3 IMPLANT
GOWN STRL REUS W/ TWL LRG LVL3 (GOWN DISPOSABLE) ×2 IMPLANT
GOWN STRL REUS W/TWL LRG LVL3 (GOWN DISPOSABLE) ×4
LENS IOL TECNIS ITEC 19.5 (Intraocular Lens) ×3 IMPLANT
MARKER SKIN DUAL TIP RULER LAB (MISCELLANEOUS) ×3 IMPLANT
NDL RETROBULBAR .5 NSTRL (NEEDLE) IMPLANT
NEEDLE FILTER BLUNT 18X 1/2SAF (NEEDLE) ×2
NEEDLE FILTER BLUNT 18X1 1/2 (NEEDLE) ×1 IMPLANT
PACK CATARACT BRASINGTON (MISCELLANEOUS) ×3 IMPLANT
PACK EYE AFTER SURG (MISCELLANEOUS) ×3 IMPLANT
PACK OPTHALMIC (MISCELLANEOUS) ×3 IMPLANT
RING MALYGIN 7.0 (MISCELLANEOUS) ×3 IMPLANT
SUT ETHILON 10-0 CS-B-6CS-B-6 (SUTURE)
SUT VICRYL  9 0 (SUTURE)
SUT VICRYL 9 0 (SUTURE) IMPLANT
SUTURE EHLN 10-0 CS-B-6CS-B-6 (SUTURE) IMPLANT
SYR 3ML LL SCALE MARK (SYRINGE) ×3 IMPLANT
SYR 5ML LL (SYRINGE) ×3 IMPLANT
SYR TB 1ML LUER SLIP (SYRINGE) ×3 IMPLANT
WATER STERILE IRR 250ML POUR (IV SOLUTION) ×3 IMPLANT
WIPE NON LINTING 3.25X3.25 (MISCELLANEOUS) ×3 IMPLANT

## 2017-03-24 NOTE — Op Note (Signed)
OPERATIVE NOTE  Isaiah SergeWilliam Gerald Catalano [id number] 03/24/2017  PREOPERATIVE DIAGNOSIS:   Nuclear sclerotic cataract left eye with miotic pupil      H25.12   POSTOPERATIVE DIAGNOSIS:   Nuclear sclerotic cataract left eye with miotic pupil.     PROCEDURE:  Phacoemulsification with posterior chamber intraocular lens implantation of the left eye which required pupil stretching with the Malyugin pupil expansion device   LENS:   Implant Name Type Inv. Item Serial No. Manufacturer Lot No. LRB No. Used  LENS IOL DIOP 19.5 - [id number] Intraocular Lens LENS IOL DIOP 19.5 [id number]  Left 1        ULTRASOUND TIME: 24 % of 2 minutes, 25 seconds.  CDE 34.5   SURGEON:  Deirdre Evenerhadwick R. Lan Entsminger, MD   ANESTHESIA: Topical with tetracaine drops and 2% Xylocaine jelly, augmented with 1% preservative-free intracameral lidocaine.   COMPLICATIONS:  None.   DESCRIPTION OF PROCEDURE:  The patient was identified in the holding room and transported to the operating room and placed in the supine position under the operating microscope.  The left eye was identified as the operative eye and it was prepped and draped in the usual sterile ophthalmic fashion.   A 1 millimeter clear-corneal paracentesis was made at the 1:30 position.  The anterior chamber was filled with Viscoat viscoelastic.  0.5 ml of preservative-free 1% lidocaine was injected into the anterior chamber.  A 2.4 millimeter keratome was used to make a near-clear corneal incision at the 10:30 position.  A Malyugin pupil expander was then placed through the main incision and into the anterior chamber of the eye.  The edge of the iris was secured on the lip of the pupil expander and it was released, thereby expanding the pupil to approximately 8 millimeters for completion of the cataract surgery.  Additional Viscoat was placed in the anterior chamber.  A cystotome and capsulorrhexis forceps were used to make a curvilinear capsulorrhexis.   Balanced salt  solution was used to hydrodissect and hydrodelineate the lens nucleus.   Phacoemulsification was used in stop and chop fashion to remove the lens, nucleus and epinucleus.  The remaining cortex was aspirated using the irrigation aspiration handpiece.  Additional Provisc was placed into the eye to distend the capsular bag for lens placement.  A lens was then injected into the capsular bag.  The pupil expanding ring was removed using a Kuglen hook and insertion device. The remaining viscoelastic was aspirated from the capsular bag and the anterior chamber.  The anterior chamber was filled with balanced salt solution to inflate to a physiologic pressure.   Wounds were hydrated with balanced salt solution.  The anterior chamber was inflated to a physiologic pressure with balanced salt solution.  No wound leaks were noted. Cefuroxime 0.1 ml of a 10mg /ml solution was injected into the anterior chamber for a dose of 1 mg of intracameral antibiotic at the completion of the case.   Timolol and Brimonidine drops were applied to the eye.  The patient was taken to the recovery room in stable condition without complications of anesthesia or surgery.  Francesca Strome 03/24/2017, 8:40 AM

## 2017-03-24 NOTE — Anesthesia Postprocedure Evaluation (Signed)
Anesthesia Post Note  Patient: Corey Evans  Procedure(s) Performed: CATARACT EXTRACTION PHACO AND INTRAOCULAR LENS PLACEMENT (IOC) COMPLICATED LEFT DIABETIC (Left )  Patient location during evaluation: PACU Anesthesia Type: MAC Level of consciousness: awake and alert Pain management: pain level controlled Vital Signs Assessment: post-procedure vital signs reviewed and stable Respiratory status: spontaneous breathing, nonlabored ventilation, respiratory function stable and patient connected to nasal cannula oxygen Cardiovascular status: stable and blood pressure returned to baseline Postop Assessment: no apparent nausea or vomiting Anesthetic complications: no    Angelie Kram

## 2017-03-24 NOTE — H&P (Signed)
The History and Physical notes are on paper, have been signed, and are to be scanned. The patient remains stable and unchanged from the H&P.   Previous H&P reviewed, patient examined, and there are no changes.  Corey Evans 03/24/2017 7:41 AM

## 2017-03-24 NOTE — Anesthesia Procedure Notes (Signed)
Procedure Name: MAC Date/Time: 03/24/2017 8:20 AM Performed by: Cameron Ali, CRNA Pre-anesthesia Checklist: Patient identified, Emergency Drugs available, Suction available, Timeout performed and Patient being monitored Patient Re-evaluated:Patient Re-evaluated prior to induction Oxygen Delivery Method: Nasal cannula Placement Confirmation: positive ETCO2

## 2017-03-24 NOTE — Transfer of Care (Signed)
Immediate Anesthesia Transfer of Care Note  Patient: Corey Evans  Procedure(s) Performed: CATARACT EXTRACTION PHACO AND INTRAOCULAR LENS PLACEMENT (IOC) COMPLICATED LEFT DIABETIC (Left )  Patient Location: PACU  Anesthesia Type: No value filed.  Level of Consciousness: awake, alert  and patient cooperative  Airway and Oxygen Therapy: Patient Spontanous Breathing and Patient connected to supplemental oxygen  Post-op Assessment: Post-op Vital signs reviewed, Patient's Cardiovascular Status Stable, Respiratory Function Stable, Patent Airway and No signs of Nausea or vomiting  Post-op Vital Signs: Reviewed and stable  Complications: No apparent anesthesia complications

## 2017-03-24 NOTE — Anesthesia Preprocedure Evaluation (Signed)
Anesthesia Evaluation  Patient identified by MRN, date of birth, ID band Patient awake    Reviewed: Allergy & Precautions, H&P , NPO status , Patient's Chart, lab work & pertinent test results, reviewed documented beta blocker date and time   History of Anesthesia Complications Negative for: history of anesthetic complications  Airway Mallampati: II  TM Distance: >3 FB Neck ROM: full    Dental no notable dental hx. (+) Upper Dentures, Lower Dentures   Pulmonary neg pulmonary ROS, former smoker,    Pulmonary exam normal breath sounds clear to auscultation       Cardiovascular Exercise Tolerance: Good hypertension, + Peripheral Vascular Disease (CEA Right)  Normal cardiovascular exam     Neuro/Psych left eye s/p small "stroke in eye" 05/2016  Right eye 'stroke' 1998 negative neurological ROS  negative psych ROS   GI/Hepatic Neg liver ROS, GERD  Controlled,  Endo/Other  diabetesHypothyroidism   Renal/GU Renal disease (ckd2)  negative genitourinary   Musculoskeletal   Abdominal   Peds  Hematology  (+) anemia ,   Anesthesia Other Findings Bilateral lower eyelids red  Reproductive/Obstetrics negative OB ROS                             Anesthesia Physical  Anesthesia Plan  ASA: II  Anesthesia Plan:    Post-op Pain Management:    Induction:   PONV Risk Score and Plan:   Airway Management Planned:   Additional Equipment:   Intra-op Plan:   Post-operative Plan:   Informed Consent: I have reviewed the patients History and Physical, chart, labs and discussed the procedure including the risks, benefits and alternatives for the proposed anesthesia with the patient or authorized representative who has indicated his/her understanding and acceptance.   Dental Advisory Given  Plan Discussed with: CRNA  Anesthesia Plan Comments:         Anesthesia Quick Evaluation

## 2017-03-25 ENCOUNTER — Encounter: Payer: Self-pay | Admitting: Ophthalmology

## 2018-03-06 ENCOUNTER — Encounter: Payer: Self-pay | Admitting: Emergency Medicine

## 2018-03-06 ENCOUNTER — Emergency Department: Payer: Medicare Other

## 2018-03-06 ENCOUNTER — Other Ambulatory Visit: Payer: Self-pay

## 2018-03-06 ENCOUNTER — Inpatient Hospital Stay
Admission: EM | Admit: 2018-03-06 | Discharge: 2018-03-08 | DRG: 082 | Disposition: E | Payer: Medicare Other | Attending: Internal Medicine | Admitting: Internal Medicine

## 2018-03-06 DIAGNOSIS — D696 Thrombocytopenia, unspecified: Secondary | ICD-10-CM | POA: Diagnosis present

## 2018-03-06 DIAGNOSIS — Z66 Do not resuscitate: Secondary | ICD-10-CM | POA: Diagnosis present

## 2018-03-06 DIAGNOSIS — Z8249 Family history of ischemic heart disease and other diseases of the circulatory system: Secondary | ICD-10-CM

## 2018-03-06 DIAGNOSIS — D72829 Elevated white blood cell count, unspecified: Secondary | ICD-10-CM | POA: Diagnosis present

## 2018-03-06 DIAGNOSIS — Y92018 Other place in single-family (private) house as the place of occurrence of the external cause: Secondary | ICD-10-CM

## 2018-03-06 DIAGNOSIS — R402352 Coma scale, best motor response, localizes pain, at arrival to emergency department: Secondary | ICD-10-CM | POA: Diagnosis present

## 2018-03-06 DIAGNOSIS — E039 Hypothyroidism, unspecified: Secondary | ICD-10-CM | POA: Diagnosis present

## 2018-03-06 DIAGNOSIS — Z9842 Cataract extraction status, left eye: Secondary | ICD-10-CM | POA: Diagnosis not present

## 2018-03-06 DIAGNOSIS — E1140 Type 2 diabetes mellitus with diabetic neuropathy, unspecified: Secondary | ICD-10-CM | POA: Diagnosis present

## 2018-03-06 DIAGNOSIS — Z79890 Hormone replacement therapy (postmenopausal): Secondary | ICD-10-CM

## 2018-03-06 DIAGNOSIS — Z7952 Long term (current) use of systemic steroids: Secondary | ICD-10-CM | POA: Diagnosis not present

## 2018-03-06 DIAGNOSIS — N179 Acute kidney failure, unspecified: Secondary | ICD-10-CM | POA: Diagnosis present

## 2018-03-06 DIAGNOSIS — S0012XA Contusion of left eyelid and periocular area, initial encounter: Secondary | ICD-10-CM | POA: Diagnosis present

## 2018-03-06 DIAGNOSIS — Z9841 Cataract extraction status, right eye: Secondary | ICD-10-CM

## 2018-03-06 DIAGNOSIS — E871 Hypo-osmolality and hyponatremia: Secondary | ICD-10-CM | POA: Diagnosis present

## 2018-03-06 DIAGNOSIS — H05232 Hemorrhage of left orbit: Secondary | ICD-10-CM

## 2018-03-06 DIAGNOSIS — Z87891 Personal history of nicotine dependence: Secondary | ICD-10-CM | POA: Diagnosis not present

## 2018-03-06 DIAGNOSIS — S06309A Unspecified focal traumatic brain injury with loss of consciousness of unspecified duration, initial encounter: Secondary | ICD-10-CM | POA: Diagnosis present

## 2018-03-06 DIAGNOSIS — J9601 Acute respiratory failure with hypoxia: Secondary | ICD-10-CM | POA: Diagnosis not present

## 2018-03-06 DIAGNOSIS — I10 Essential (primary) hypertension: Secondary | ICD-10-CM | POA: Diagnosis present

## 2018-03-06 DIAGNOSIS — R402242 Coma scale, best verbal response, confused conversation, at arrival to emergency department: Secondary | ICD-10-CM | POA: Diagnosis present

## 2018-03-06 DIAGNOSIS — Z7982 Long term (current) use of aspirin: Secondary | ICD-10-CM

## 2018-03-06 DIAGNOSIS — Z7984 Long term (current) use of oral hypoglycemic drugs: Secondary | ICD-10-CM

## 2018-03-06 DIAGNOSIS — E785 Hyperlipidemia, unspecified: Secondary | ICD-10-CM | POA: Diagnosis present

## 2018-03-06 DIAGNOSIS — W010XXA Fall on same level from slipping, tripping and stumbling without subsequent striking against object, initial encounter: Secondary | ICD-10-CM | POA: Diagnosis present

## 2018-03-06 DIAGNOSIS — I4510 Unspecified right bundle-branch block: Secondary | ICD-10-CM | POA: Diagnosis present

## 2018-03-06 DIAGNOSIS — S0630AA Unspecified focal traumatic brain injury with loss of consciousness status unknown, initial encounter: Secondary | ICD-10-CM | POA: Diagnosis present

## 2018-03-06 DIAGNOSIS — R402132 Coma scale, eyes open, to sound, at arrival to emergency department: Secondary | ICD-10-CM | POA: Diagnosis present

## 2018-03-06 DIAGNOSIS — S065X9A Traumatic subdural hemorrhage with loss of consciousness of unspecified duration, initial encounter: Principal | ICD-10-CM | POA: Diagnosis present

## 2018-03-06 DIAGNOSIS — Z961 Presence of intraocular lens: Secondary | ICD-10-CM | POA: Diagnosis present

## 2018-03-06 DIAGNOSIS — Z833 Family history of diabetes mellitus: Secondary | ICD-10-CM | POA: Diagnosis not present

## 2018-03-06 DIAGNOSIS — W19XXXA Unspecified fall, initial encounter: Secondary | ICD-10-CM

## 2018-03-06 DIAGNOSIS — Z515 Encounter for palliative care: Secondary | ICD-10-CM | POA: Diagnosis present

## 2018-03-06 DIAGNOSIS — S0083XA Contusion of other part of head, initial encounter: Secondary | ICD-10-CM | POA: Diagnosis not present

## 2018-03-06 DIAGNOSIS — G9340 Encephalopathy, unspecified: Secondary | ICD-10-CM | POA: Diagnosis present

## 2018-03-06 LAB — CBC WITH DIFFERENTIAL/PLATELET
Abs Immature Granulocytes: 1.24 10*3/uL — ABNORMAL HIGH (ref 0.00–0.07)
Basophils Absolute: 0.1 10*3/uL (ref 0.0–0.1)
Basophils Relative: 0 %
Eosinophils Absolute: 0 10*3/uL (ref 0.0–0.5)
Eosinophils Relative: 0 %
HCT: 33.7 % — ABNORMAL LOW (ref 39.0–52.0)
Hemoglobin: 10.9 g/dL — ABNORMAL LOW (ref 13.0–17.0)
Immature Granulocytes: 3 %
Lymphocytes Relative: 2 %
Lymphs Abs: 0.9 10*3/uL (ref 0.7–4.0)
MCH: 30.2 pg (ref 26.0–34.0)
MCHC: 32.3 g/dL (ref 30.0–36.0)
MCV: 93.4 fL (ref 80.0–100.0)
MONO ABS: 12.5 10*3/uL — AB (ref 0.1–1.0)
Monocytes Relative: 33 %
Neutro Abs: 23.4 10*3/uL — ABNORMAL HIGH (ref 1.7–7.7)
Neutrophils Relative %: 62 %
Platelets: 65 10*3/uL — ABNORMAL LOW (ref 150–400)
RBC: 3.61 MIL/uL — ABNORMAL LOW (ref 4.22–5.81)
RDW: 14.6 % (ref 11.5–15.5)
WBC: 38.2 10*3/uL — ABNORMAL HIGH (ref 4.0–10.5)
nRBC: 0 % (ref 0.0–0.2)

## 2018-03-06 LAB — BASIC METABOLIC PANEL
Anion gap: 13 (ref 5–15)
BUN: 26 mg/dL — ABNORMAL HIGH (ref 8–23)
CO2: 24 mmol/L (ref 22–32)
Calcium: 9.5 mg/dL (ref 8.9–10.3)
Chloride: 96 mmol/L — ABNORMAL LOW (ref 98–111)
Creatinine, Ser: 1.44 mg/dL — ABNORMAL HIGH (ref 0.61–1.24)
GFR calc Af Amer: 50 mL/min — ABNORMAL LOW (ref >60)
GFR calc non Af Amer: 43 mL/min — ABNORMAL LOW (ref >60)
Glucose, Bld: 357 mg/dL — ABNORMAL HIGH (ref 70–99)
POTASSIUM: 4 mmol/L (ref 3.5–5.1)
Sodium: 133 mmol/L — ABNORMAL LOW (ref 135–145)

## 2018-03-06 LAB — CK: Total CK: 50 U/L (ref 49–397)

## 2018-03-06 LAB — APTT: APTT: 33 s (ref 24–36)

## 2018-03-06 LAB — PROTIME-INR
INR: 1
Prothrombin Time: 13.1 seconds (ref 11.4–15.2)

## 2018-03-06 MED ORDER — MORPHINE SULFATE (CONCENTRATE) 10 MG/0.5ML PO SOLN
5.0000 mg | ORAL | Status: DC | PRN
  Filled 2018-03-06: qty 1

## 2018-03-06 MED ORDER — INSULIN ASPART 100 UNIT/ML ~~LOC~~ SOLN: 0.0000 [IU] | Freq: Every day | SUBCUTANEOUS | Status: DC

## 2018-03-06 MED ORDER — SENNOSIDES-DOCUSATE SODIUM 8.6-50 MG PO TABS: 1.0000 | ORAL_TABLET | Freq: Every evening | ORAL | Status: DC | PRN

## 2018-03-06 MED ORDER — ONDANSETRON HCL 4 MG/2ML IJ SOLN: 4.0000 mg | Freq: Four times a day (QID) | INTRAMUSCULAR | Status: DC | PRN

## 2018-03-06 MED ORDER — METOPROLOL TARTRATE 5 MG/5ML IV SOLN: 2.5000 mg | INTRAVENOUS | Status: DC | PRN

## 2018-03-06 MED ORDER — LABETALOL HCL 5 MG/ML IV SOLN
10.0000 mg | Freq: Once | INTRAVENOUS | Status: AC
  Administered 2018-03-06: 10 mg via INTRAVENOUS

## 2018-03-06 MED ORDER — INSULIN ASPART 100 UNIT/ML ~~LOC~~ SOLN: 0.0000 [IU] | Freq: Three times a day (TID) | SUBCUTANEOUS | Status: DC

## 2018-03-06 MED ORDER — LORAZEPAM 2 MG/ML IJ SOLN: 1.0000 mg | INTRAMUSCULAR | Status: DC | PRN

## 2018-03-06 MED ORDER — MORPHINE SULFATE (CONCENTRATE) 10 MG/0.5ML PO SOLN
5.0000 mg | ORAL | Status: DC | PRN
  Administered 2018-03-07: 10:00:00 5 mg via SUBLINGUAL

## 2018-03-06 MED ORDER — HALOPERIDOL 0.5 MG PO TABS
0.5000 mg | ORAL_TABLET | ORAL | Status: DC | PRN
  Filled 2018-03-06: qty 1

## 2018-03-06 MED ORDER — TRANEXAMIC ACID 1000 MG/10ML IV SOLN
1000.0000 mg | Freq: Once | INTRAVENOUS | Status: DC
  Filled 2018-03-06: qty 10

## 2018-03-06 MED ORDER — ONDANSETRON HCL 4 MG PO TABS: 4.0000 mg | ORAL_TABLET | Freq: Four times a day (QID) | ORAL | Status: DC | PRN

## 2018-03-06 MED ORDER — ONDANSETRON HCL 4 MG/2ML IJ SOLN
INTRAMUSCULAR | Status: AC
  Administered 2018-03-06: 4 mg
  Filled 2018-03-06: qty 2

## 2018-03-06 MED ORDER — ALBUTEROL SULFATE (2.5 MG/3ML) 0.083% IN NEBU: 2.5000 mg | INHALATION_SOLUTION | RESPIRATORY_TRACT | Status: DC | PRN

## 2018-03-06 MED ORDER — BISACODYL 10 MG RE SUPP
10.0000 mg | Freq: Every day | RECTAL | Status: DC | PRN
  Filled 2018-03-06: qty 1

## 2018-03-06 MED ORDER — DIPHENHYDRAMINE HCL 50 MG/ML IJ SOLN
12.5000 mg | INTRAMUSCULAR | Status: DC | PRN
  Filled 2018-03-06: qty 0.25

## 2018-03-06 MED ORDER — NICARDIPINE HCL IN NACL 20-0.86 MG/200ML-% IV SOLN: 3.0000 mg/h | INTRAVENOUS | Status: DC

## 2018-03-06 MED ORDER — TRANEXAMIC ACID-NACL 1000-0.7 MG/100ML-% IV SOLN
1000.0000 mg | Freq: Once | INTRAVENOUS | Status: DC
  Filled 2018-03-06: qty 100

## 2018-03-06 MED ORDER — ACETAMINOPHEN 650 MG RE SUPP: 650.0000 mg | Freq: Four times a day (QID) | RECTAL | Status: DC | PRN

## 2018-03-06 MED ORDER — ACETAMINOPHEN 325 MG PO TABS: 650.0000 mg | ORAL_TABLET | Freq: Four times a day (QID) | ORAL | Status: DC | PRN

## 2018-03-06 MED ORDER — HALOPERIDOL LACTATE 5 MG/ML IJ SOLN: 0.5000 mg | INTRAMUSCULAR | Status: DC | PRN

## 2018-03-06 MED ORDER — LABETALOL HCL 5 MG/ML IV SOLN
INTRAVENOUS | Status: AC
  Filled 2018-03-06: qty 4

## 2018-03-06 MED ORDER — LORAZEPAM 2 MG/ML IJ SOLN
INTRAMUSCULAR | Status: AC
  Administered 2018-03-06: 2 mg
  Filled 2018-03-06: qty 1

## 2018-03-06 MED ORDER — HALOPERIDOL LACTATE 2 MG/ML PO CONC
0.5000 mg | ORAL | Status: DC | PRN
  Filled 2018-03-06: qty 0.3

## 2018-03-06 MED ORDER — MORPHINE SULFATE (PF) 2 MG/ML IV SOLN: 1.0000 mg | INTRAVENOUS | Status: DC | PRN

## 2018-03-06 MED ORDER — MORPHINE SULFATE (PF) 4 MG/ML IV SOLN
INTRAVENOUS | Status: AC
  Administered 2018-03-06: 4 mg
  Filled 2018-03-06: qty 1

## 2018-03-06 NOTE — ED Notes (Signed)
ED TO INPATIENT HANDOFF REPORT  Name/Age/Gender Corey Evans 84 y.o. male  Code Status   Home/SNF/Other comfort care  Chief Complaint unresponsive  Level of Care/Admitting Diagnosis ED Disposition    ED Disposition Condition Comment   Admit  The patient appears reasonably stabilized for admission considering the current resources, flow, and capabilities available in the ED at this time, and I doubt any [facility] and/or treatment in the ED prior to admission is  present.       Medical History Past Medical History:  Diagnosis Date  . Anemia   . 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 Diabetes mellitus without complication (HCC)   . Hyperlipidemia   . Hypertension   . Hypothyroidism   . Neuropathy    left eye s/p small "stroke in eye" recently   . Occlusion and stenosis of carotid artery without mention of cerebral infarction   . Wears dentures    full upper and lower    Allergies No Known Allergies  IV Location/Drains/Wounds Patient Lines/Drains/Airways Status   Active Line/Drains/Airways    Name:   Placement date:   Placement time:   Site:   Days:   Peripheral IV 02/20/2018 Right Wrist   02/09/2018    1443    Wrist   less than 1   Peripheral IV 02/17/2018 Right Arm   03/04/2018    1450    Arm   less than 1   Incision (Closed) 08/14/16 Eye Right   08/14/16    0824     569   Incision (Closed) 03/24/17 Eye Left   03/24/17    0828     347   Incision (Closed) 03/24/17 Eye Left   03/24/17    0828     347          Labs/Imaging No results found for this or any previous visit (from the past 48 hour(s)). Ct Head Wo Contrast  Result Date: 02/25/2018 CLINICAL DATA:  84 year old male with a history of fall and suspected head injury EXAM: CT HEAD WITHOUT CONTRAST CT MAXILLOFACIAL WITHOUT CONTRAST CT CERVICAL SPINE WITHOUT CONTRAST TECHNIQUE: Multidetector CT imaging of the head, cervical spine, and  maxillofacial structures were performed using the standard protocol without intravenous contrast. Multiplanar CT image reconstructions of the cervical spine and maxillofacial structures were also generated. COMPARISON:  None. FINDINGS: CT HEAD FINDINGS Brain: Acute subdural hemorrhage extending from the frontal convexity posteriorly over the parietal lobe. Hemorrhage extends into the middle cranial fossa, subtemporal. The greatest thickness of the subdural component measures 7 mm at the sylvian fissure. Hemorrhage within the middle cranial fossa is partly intraparenchymal of the temporal lobe, extends to the temporal pole, and posteriorly along the petroclival ligament. Hematoma within the middle cranial fossa measures 4.3 cm x 2.2 cm. Partial effacement of the right lateral ventricle. Basal cisterns patent. Left lateral ventricle patent. Third ventricle patent though partially effaced. Suprasellar cisterns patent, with effacement of the right sylvian cistern secondary to blood products. Vascular: Calcifications of the intracranial vasculature. Skull: No acute fracture.  No aggressive bony lesion. Other: No scalp hematoma or radiopaque foreign body. CT MAXILLOFACIAL FINDINGS Osseous: Edentulous mandible. No fracture identified. Edentulous maxilla. No fracture identified. No midface fracture. No orbital fracture. Orbits: Surgical changes of bilateral lens extraction. Otherwise unremarkable orbits. Sinuses: Trace mucosal disease of the ethmoid air cells and frontal recesses. No mastoid effusion. Soft tissues: No radiopaque foreign body.  No  soft tissue swelling. CT CERVICAL SPINE FINDINGS Alignment: Alignment maintained with no subluxation. No significant anterolisthesis/retrolisthesis. Skull base and vertebrae: Skull base unremarkable with no acute fracture. Craniocervical junction maintained. No acute fracture of the cervical vertebral bodies. Soft tissues and spinal canal: No focal soft tissue swelling. No canal  hematoma. Carotid calcifications. Disc levels: Degenerative disc disease throughout the cervical spine with disc space narrowing, uncovertebral joint disease, and anterior osteophyte production, which is most pronounced at C5-C6 and C6-C7. Posterior disc osteophyte complex at the C6-C7 level, which is associated with posterior calcification, potentially degenerative or meningioma. This results in narrowing of the canal at this disc level, approximately 6 mm-7 mm. Mild left foraminal narrowing at C2-C3 secondary to uncovertebral joint disease and facet disease. Advanced foraminal narrowing at C3-C4 on the left secondary to uncovertebral joint disease and facet disease. Advanced left foraminal narrowing at C4-C5 and moderate right-sided, secondary to uncovertebral joint disease and facet disease. Upper chest: Unremarkable Other: None IMPRESSION: Head CT: Multicompartmental hemorrhage of the right hemisphere, predominantly subdural component and intraparenchymal hemorrhage of the right temporal lobe. Holo hemispheric subdural hemorrhage extends from the frontal region over the temporal and parietal lobe with the greatest thickness measured at the sylvian fissure, 7 mm. Midline shift at the foramen of Monro measuring 9 mm. Hematoma within the middle cranial fossa continuous with the petroclival ligament measures 4.3 cm x 2.2 cm, with associated early uncal herniation. Preliminary results were called by telephone at the time of interpretation on 14-Mar-2018 at 2:41 pm to Dr. Nita Sickle , who verbally acknowledged these results. Cervical CT: No acute fracture or malalignment of the cervical spine. Multilevel degenerative changes, with foraminal narrowing worst at the levels of C2-C5 as above. There is canal narrowing at the C6-C7 level secondary to posterior disc osteophyte complex and posterior canal calcifications. Canal measures 6 mm-7 mm at this site. Maxillofacial CT: No acute fracture. Electronically Signed    By: Gilmer Mor D.O.   On: 2018-03-14 14:56   Ct Cervical Spine Wo Contrast  Result Date: 2018-03-14 CLINICAL DATA:  84 year old male with a history of fall and suspected head injury EXAM: CT HEAD WITHOUT CONTRAST CT MAXILLOFACIAL WITHOUT CONTRAST CT CERVICAL SPINE WITHOUT CONTRAST TECHNIQUE: Multidetector CT imaging of the head, cervical spine, and maxillofacial structures were performed using the standard protocol without intravenous contrast. Multiplanar CT image reconstructions of the cervical spine and maxillofacial structures were also generated. COMPARISON:  None. FINDINGS: CT HEAD FINDINGS Brain: Acute subdural hemorrhage extending from the frontal convexity posteriorly over the parietal lobe. Hemorrhage extends into the middle cranial fossa, subtemporal. The greatest thickness of the subdural component measures 7 mm at the sylvian fissure. Hemorrhage within the middle cranial fossa is partly intraparenchymal of the temporal lobe, extends to the temporal pole, and posteriorly along the petroclival ligament. Hematoma within the middle cranial fossa measures 4.3 cm x 2.2 cm. Partial effacement of the right lateral ventricle. Basal cisterns patent. Left lateral ventricle patent. Third ventricle patent though partially effaced. Suprasellar cisterns patent, with effacement of the right sylvian cistern secondary to blood products. Vascular: Calcifications of the intracranial vasculature. Skull: No acute fracture.  No aggressive bony lesion. Other: No scalp hematoma or radiopaque foreign body. CT MAXILLOFACIAL FINDINGS Osseous: Edentulous mandible. No fracture identified. Edentulous maxilla. No fracture identified. No midface fracture. No orbital fracture. Orbits: Surgical changes of bilateral lens extraction. Otherwise unremarkable orbits. Sinuses: Trace mucosal disease of the ethmoid air cells and frontal recesses. No mastoid effusion. Soft tissues: No  radiopaque foreign body.  No soft tissue swelling. CT  CERVICAL SPINE FINDINGS Alignment: Alignment maintained with no subluxation. No significant anterolisthesis/retrolisthesis. Skull base and vertebrae: Skull base unremarkable with no acute fracture. Craniocervical junction maintained. No acute fracture of the cervical vertebral bodies. Soft tissues and spinal canal: No focal soft tissue swelling. No canal hematoma. Carotid calcifications. Disc levels: Degenerative disc disease throughout the cervical spine with disc space narrowing, uncovertebral joint disease, and anterior osteophyte production, which is most pronounced at C5-C6 and C6-C7. Posterior disc osteophyte complex at the C6-C7 level, which is associated with posterior calcification, potentially degenerative or meningioma. This results in narrowing of the canal at this disc level, approximately 6 mm-7 mm. Mild left foraminal narrowing at C2-C3 secondary to uncovertebral joint disease and facet disease. Advanced foraminal narrowing at C3-C4 on the left secondary to uncovertebral joint disease and facet disease. Advanced left foraminal narrowing at C4-C5 and moderate right-sided, secondary to uncovertebral joint disease and facet disease. Upper chest: Unremarkable Other: None IMPRESSION: Head CT: Multicompartmental hemorrhage of the right hemisphere, predominantly subdural component and intraparenchymal hemorrhage of the right temporal lobe. Holo hemispheric subdural hemorrhage extends from the frontal region over the temporal and parietal lobe with the greatest thickness measured at the sylvian fissure, 7 mm. Midline shift at the foramen of Monro measuring 9 mm. Hematoma within the middle cranial fossa continuous with the petroclival ligament measures 4.3 cm x 2.2 cm, with associated early uncal herniation. Preliminary results were called by telephone at the time of interpretation on 04-04-2018 at 2:41 pm to Dr. Nita Sickle , who verbally acknowledged these results. Cervical CT: No acute fracture or  malalignment of the cervical spine. Multilevel degenerative changes, with foraminal narrowing worst at the levels of C2-C5 as above. There is canal narrowing at the C6-C7 level secondary to posterior disc osteophyte complex and posterior canal calcifications. Canal measures 6 mm-7 mm at this site. Maxillofacial CT: No acute fracture. Electronically Signed   By: Gilmer Mor D.O.   On: 04/04/18 14:56   Ct Maxillofacial Wo Contrast  Result Date: 2018-04-04 CLINICAL DATA:  84 year old male with a history of fall and suspected head injury EXAM: CT HEAD WITHOUT CONTRAST CT MAXILLOFACIAL WITHOUT CONTRAST CT CERVICAL SPINE WITHOUT CONTRAST TECHNIQUE: Multidetector CT imaging of the head, cervical spine, and maxillofacial structures were performed using the standard protocol without intravenous contrast. Multiplanar CT image reconstructions of the cervical spine and maxillofacial structures were also generated. COMPARISON:  None. FINDINGS: CT HEAD FINDINGS Brain: Acute subdural hemorrhage extending from the frontal convexity posteriorly over the parietal lobe. Hemorrhage extends into the middle cranial fossa, subtemporal. The greatest thickness of the subdural component measures 7 mm at the sylvian fissure. Hemorrhage within the middle cranial fossa is partly intraparenchymal of the temporal lobe, extends to the temporal pole, and posteriorly along the petroclival ligament. Hematoma within the middle cranial fossa measures 4.3 cm x 2.2 cm. Partial effacement of the right lateral ventricle. Basal cisterns patent. Left lateral ventricle patent. Third ventricle patent though partially effaced. Suprasellar cisterns patent, with effacement of the right sylvian cistern secondary to blood products. Vascular: Calcifications of the intracranial vasculature. Skull: No acute fracture.  No aggressive bony lesion. Other: No scalp hematoma or radiopaque foreign body. CT MAXILLOFACIAL FINDINGS Osseous: Edentulous mandible. No  fracture identified. Edentulous maxilla. No fracture identified. No midface fracture. No orbital fracture. Orbits: Surgical changes of bilateral lens extraction. Otherwise unremarkable orbits. Sinuses: Trace mucosal disease of the ethmoid air cells and frontal recesses. No  mastoid effusion. Soft tissues: No radiopaque foreign body.  No soft tissue swelling. CT CERVICAL SPINE FINDINGS Alignment: Alignment maintained with no subluxation. No significant anterolisthesis/retrolisthesis. Skull base and vertebrae: Skull base unremarkable with no acute fracture. Craniocervical junction maintained. No acute fracture of the cervical vertebral bodies. Soft tissues and spinal canal: No focal soft tissue swelling. No canal hematoma. Carotid calcifications. Disc levels: Degenerative disc disease throughout the cervical spine with disc space narrowing, uncovertebral joint disease, and anterior osteophyte production, which is most pronounced at C5-C6 and C6-C7. Posterior disc osteophyte complex at the C6-C7 level, which is associated with posterior calcification, potentially degenerative or meningioma. This results in narrowing of the canal at this disc level, approximately 6 mm-7 mm. Mild left foraminal narrowing at C2-C3 secondary to uncovertebral joint disease and facet disease. Advanced foraminal narrowing at C3-C4 on the left secondary to uncovertebral joint disease and facet disease. Advanced left foraminal narrowing at C4-C5 and moderate right-sided, secondary to uncovertebral joint disease and facet disease. Upper chest: Unremarkable Other: None IMPRESSION: Head CT: Multicompartmental hemorrhage of the right hemisphere, predominantly subdural component and intraparenchymal hemorrhage of the right temporal lobe. Holo hemispheric subdural hemorrhage extends from the frontal region over the temporal and parietal lobe with the greatest thickness measured at the sylvian fissure, 7 mm. Midline shift at the foramen of Monro  measuring 9 mm. Hematoma within the middle cranial fossa continuous with the petroclival ligament measures 4.3 cm x 2.2 cm, with associated early uncal herniation. Preliminary results were called by telephone at the time of interpretation on 02/09/2018 at 2:41 pm to Dr. Nita Sickle , who verbally acknowledged these results. Cervical CT: No acute fracture or malalignment of the cervical spine. Multilevel degenerative changes, with foraminal narrowing worst at the levels of C2-C5 as above. There is canal narrowing at the C6-C7 level secondary to posterior disc osteophyte complex and posterior canal calcifications. Canal measures 6 mm-7 mm at this site. Maxillofacial CT: No acute fracture. Electronically Signed   By: Gilmer Mor D.O.   On: 02/19/2018 14:56    Pending Labs Unresulted Labs (From admission, onward)    Start     Ordered   03/02/2018 1447  Protime-INR  ONCE - STAT,   STAT     02/28/2018 1446   02/07/2018 1447  APTT  ONCE - STAT,   STAT     03/02/2018 1446   02/28/2018 1447  Type and screen  ONCE - STAT,   STAT     02/14/2018 1446   03/03/2018 1408  CBC with Differential/Platelet  ONCE - STAT,   STAT     03/04/2018 1407   02/21/2018 1408  Basic metabolic panel  ONCE - STAT,   STAT     02/25/2018 1407   02/18/2018 1408  CK  ONCE - STAT,   STAT     03/02/2018 1407          Vitals/Pain Today's Vitals   02/17/2018 1430 02/10/2018 1453 02/24/2018 1459 02/26/2018 1500  BP: (!) 215/78 (!) 108/102 (!) 193/74 (!) 176/76  Pulse: 90 83 84 87  Resp: (!) 9 18 18 17   Temp:      TempSrc:      SpO2: 95% 94% 94% 92%  Weight:      Height:        Isolation Precautions No active isolations  Medications Medications  labetalol (NORMODYNE,TRANDATE) 5 MG/ML injection (  Not Given 02/16/2018 1501)  labetalol (NORMODYNE,TRANDATE) injection 10 mg (10 mg Intravenous Given 03/03/2018 1456)  Mobility non-ambulatory

## 2018-03-06 NOTE — ED Notes (Signed)
MD has consulted with family, patient increasingly somnolent but does awaken briefly when spoken to loudly.

## 2018-03-06 NOTE — ED Triage Notes (Signed)
Per EMS family found patient at home unable to get out of bed. Per report and patient he fell yesterday and hit face on ground. He states he misjudged a step. States headache. Noted drowy. L facial ecchymosis and full facial edema. Denies neck pain. Alert and oriented and moves all extremities weakly.

## 2018-03-06 NOTE — ED Notes (Signed)
Patient resting, resp unlabored but irregular.

## 2018-03-06 NOTE — ED Provider Notes (Signed)
[facility] Emergency Department Provider Note  ____________________________________________  Time seen: Approximately 4:23 PM  I have reviewed the triage vital signs and the nursing notes.   HISTORY  Chief Complaint Head Injury  Level 5 caveat:  Portions of the history and physical were unable to be obtained due to AMS   HPI Marcia Lepera is a 84 y.o. male history of diabetes, hypertension, hyperlipidemia, hypothyroidism who presents for evaluation of mechanical fall.  History is gathered mostly from patient's daughter and son-in-law.  According to them, patient was at their house yesterday for Thanksgiving dinner.  On his way home when he tripped and fell face forward onto the mulch outside of the house.  Family wanted to bring him to the hospital however he refused.  This morning they went to check on him and patient was laying in bed, unable to stand up.  Patient told him that he had vomited several times last night.  Patient was found to have a huge bruise on the left side of his face.  Upon arrival to the emergency room patient has a GCS of 12 and is able to provide very limited history.   Past Medical History:  Diagnosis Date  . Anemia   . 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 Diabetes mellitus without complication (HCC)   . Hyperlipidemia   . Hypertension   . Hypothyroidism   . Neuropathy    left eye s/p small "stroke in eye" recently   . Occlusion and stenosis of carotid artery without mention of cerebral infarction   . Wears dentures    full upper and lower    Patient Active Problem List   Diagnosis Date Noted  . Intracranial hemorrhage following injury (HCC) 03/01/2018    Past Surgical History:  Procedure Laterality Date  . CAROTID ENDARTERECTOMY Right 1998  . CATARACT EXTRACTION W/PHACO Right 08/14/2016   Procedure: CATARACT EXTRACTION PHACO AND INTRAOCULAR LENS PLACEMENT (IOC)  Complicated Right  Diabetic;  Surgeon: Lockie Mola, MD;  Location: [facility];  Service: Ophthalmology;  Laterality: Right;  Diabetic - oral meds Malyugin  . CATARACT EXTRACTION W/PHACO Left 03/24/2017   Procedure: CATARACT EXTRACTION PHACO AND INTRAOCULAR LENS PLACEMENT (IOC) COMPLICATED LEFT DIABETIC;  Surgeon: Lockie Mola, MD;  Location: [facility];  Service: Ophthalmology;  Laterality: Left;  Diabetic - oral meds  . NASAL FRACTURE SURGERY  [id number]  . SIGMOIDOSCOPY  2005    Prior to Admission medications   Medication Sig Start Date End Date Taking? Authorizing Provider  amLODipine (NORVASC) 5 MG tablet Take 5 mg by mouth daily.   Yes [provider]  aspirin 81 MG tablet Take 81 mg by mouth daily.   Yes [provider]  Cholecalciferol 50 MCG (2000 UT) TABS Take 4,000 Units by mouth daily.    Yes [provider]  glimepiride (AMARYL) 4 MG tablet Take 4 mg by mouth daily with breakfast.    Yes [provider]  levothyroxine (SYNTHROID, LEVOTHROID) 50 MCG tablet Take 50 mcg by mouth daily before breakfast.    Yes [provider]  losartan (COZAAR) 50 MG tablet Take 50 mg by mouth daily.   Yes [provider]  metFORMIN (GLUCOPHAGE) 500 MG tablet Take 1,000 mg by mouth 2 (two) times daily with a meal.    Yes [provider]  metoprolol succinate (TOPROL-XL) 25 MG 24 hr tablet Take 25 mg by mouth daily.  Yes [provider]  Multiple Vitamin (MULTIVITAMIN) tablet Take 1 tablet by mouth daily.   Yes [provider]  Omega-3 Fatty Acids (FISH OIL) 1000 MG CAPS Take 1 capsule by mouth 2 (two) times daily.    Yes [provider]  pioglitazone (ACTOS) 30 MG tablet Take 30 mg by mouth daily.   Yes [provider]  predniSONE (DELTASONE) 5 MG tablet Take 10 mg by mouth daily.    Yes [provider]    Allergies Patient has no known allergies.  Family History  Problem  Relation Age of Onset  . Hypertension Father   . Congestive Heart Failure Sister   . Diabetes Brother     Social History Social History   Tobacco Use  . Smoking status: Former Smoker    Packs/day: 2.00    Years: 30.00    Pack years: 60.00    Last attempt to quit: 10/08/1976    Years since quitting: 41.4  . Smokeless tobacco: Never Used  Substance Use Topics  . Alcohol use: No  . Drug use: No    Review of Systems Constitutional: Negative for fever. ENT: + facial injury. No neck injury Cardiovascular: Negative for chest injury. Respiratory: Negative for shortness of breath. Negative for chest wall injury. Musculoskeletal: Negative for back injury, negative for arm or leg pain. Skin: Negative for laceration/abrasions. Neurological: + head injury.  ____________________________________________   PHYSICAL EXAM:  VITAL SIGNS: ED Triage Vitals [02/10/2018 1341]  Enc Vitals Group     BP (!) 190/79     Pulse Rate 80     Resp 18     Temp 98 F (36.7 C)     Temp Source Oral     SpO2 96 %     Weight 157 lb (71.2 kg)     Height 5\' 10"  (1.778 m)     Head Circumference      Peak Flow      Pain Score      Pain Loc      Pain Edu?      Excl. in GC?    Full spinal precautions maintained throughout the trauma exam. Constitutional: GCS 12 HEENT Head: Normocephalic, large periorbital hematoma Face: No facial bony tenderness. Stable midface Ears: No hemotympanum bilaterally. No Battle sign Eyes: No eye injury. PERRL. No raccoon eyes Nose: Nontender. No epistaxis. No rhinorrhea Mouth/Throat: Mucous membranes are moist. No oropharyngeal blood. No dental injury. Airway patent without stridor. Normal voice. Neck: no C-collar in place. No midline c-spine tenderness.  Cardiovascular: Normal rate, regular rhythm. Normal and symmetric distal pulses are present in all extremities. Pulmonary/Chest: Chest wall is stable and nontender to palpation/compression. Normal respiratory effort.  Breath sounds are normal. No crepitus.  Abdominal: Soft, nontender, non distended. Musculoskeletal: Nontender with normal full range of motion in all extremities. No deformities. No thoracic or lumbar midline spinal tenderness. Pelvis is stable. Skin: Skin is warm, dry and intact. No abrasions or contutions. Neurological:  Face is symmetric, moves all extremities, limited responses  Glascow Coma Score: 3 - Opens eyes to loud noise or command 5 - Pushes away noxious stimulus 4 - Seems confused, disoriented GCS: 12   ____________________________________________   LABS (all labs ordered are listed, but only abnormal results are displayed)  Labs Reviewed  CBC WITH DIFFERENTIAL/PLATELET - Abnormal; Notable for the following components:      Result Value   WBC 38.2 (*)    RBC 3.61 (*)    Hemoglobin 10.9 (*)  HCT 33.7 (*)    Platelets 65 (*)    Neutro Abs 23.4 (*)    Monocytes Absolute 12.5 (*)    Abs Immature Granulocytes 1.24 (*)    All other components within normal limits  BASIC METABOLIC PANEL - Abnormal; Notable for the following components:   Sodium 133 (*)    Chloride 96 (*)    Glucose, Bld 357 (*)    BUN 26 (*)    Creatinine, Ser 1.44 (*)    GFR calc non Af Amer 43 (*)    GFR calc Af Amer 50 (*)    All other components within normal limits  CK  PROTIME-INR  APTT   ____________________________________________  EKG  ED ECG REPORT I, Nita Sickle, the attending physician, personally viewed and interpreted this ECG.  Sinus tachycardia, rate of 100, right bundle branch block, normal QTC, normal axis, no ST elevations or depressions. ____________________________________________  RADIOLOGY  I have personally reviewed the images performed during this visit and I agree with the Radiologist's read.   Interpretation by Radiologist:  Ct Head Wo Contrast  Result Date: 03/03/2018 CLINICAL DATA:  84 year old male with a history of fall and suspected head injury  EXAM: CT HEAD WITHOUT CONTRAST CT MAXILLOFACIAL WITHOUT CONTRAST CT CERVICAL SPINE WITHOUT CONTRAST TECHNIQUE: Multidetector CT imaging of the head, cervical spine, and maxillofacial structures were performed using the standard protocol without intravenous contrast. Multiplanar CT image reconstructions of the cervical spine and maxillofacial structures were also generated. COMPARISON:  None. FINDINGS: CT HEAD FINDINGS Brain: Acute subdural hemorrhage extending from the frontal convexity posteriorly over the parietal lobe. Hemorrhage extends into the middle cranial fossa, subtemporal. The greatest thickness of the subdural component measures 7 mm at the sylvian fissure. Hemorrhage within the middle cranial fossa is partly intraparenchymal of the temporal lobe, extends to the temporal pole, and posteriorly along the petroclival ligament. Hematoma within the middle cranial fossa measures 4.3 cm x 2.2 cm. Partial effacement of the right lateral ventricle. Basal cisterns patent. Left lateral ventricle patent. Third ventricle patent though partially effaced. Suprasellar cisterns patent, with effacement of the right sylvian cistern secondary to blood products. Vascular: Calcifications of the intracranial vasculature. Skull: No acute fracture.  No aggressive bony lesion. Other: No scalp hematoma or radiopaque foreign body. CT MAXILLOFACIAL FINDINGS Osseous: Edentulous mandible. No fracture identified. Edentulous maxilla. No fracture identified. No midface fracture. No orbital fracture. Orbits: Surgical changes of bilateral lens extraction. Otherwise unremarkable orbits. Sinuses: Trace mucosal disease of the ethmoid air cells and frontal recesses. No mastoid effusion. Soft tissues: No radiopaque foreign body.  No soft tissue swelling. CT CERVICAL SPINE FINDINGS Alignment: Alignment maintained with no subluxation. No significant anterolisthesis/retrolisthesis. Skull base and vertebrae: Skull base unremarkable with no acute  fracture. Craniocervical junction maintained. No acute fracture of the cervical vertebral bodies. Soft tissues and spinal canal: No focal soft tissue swelling. No canal hematoma. Carotid calcifications. Disc levels: Degenerative disc disease throughout the cervical spine with disc space narrowing, uncovertebral joint disease, and anterior osteophyte production, which is most pronounced at C5-C6 and C6-C7. Posterior disc osteophyte complex at the C6-C7 level, which is associated with posterior calcification, potentially degenerative or meningioma. This results in narrowing of the canal at this disc level, approximately 6 mm-7 mm. Mild left foraminal narrowing at C2-C3 secondary to uncovertebral joint disease and facet disease. Advanced foraminal narrowing at C3-C4 on the left secondary to uncovertebral joint disease and facet disease. Advanced left foraminal narrowing at C4-C5 and moderate right-sided, secondary to  uncovertebral joint disease and facet disease. Upper chest: Unremarkable Other: None IMPRESSION: Head CT: Multicompartmental hemorrhage of the right hemisphere, predominantly subdural component and intraparenchymal hemorrhage of the right temporal lobe. Holo hemispheric subdural hemorrhage extends from the frontal region over the temporal and parietal lobe with the greatest thickness measured at the sylvian fissure, 7 mm. Midline shift at the foramen of Monro measuring 9 mm. Hematoma within the middle cranial fossa continuous with the petroclival ligament measures 4.3 cm x 2.2 cm, with associated early uncal herniation. Preliminary results were called by telephone at the time of interpretation on 02/26/2018 at 2:41 pm to Dr. Nita Sickle , who verbally acknowledged these results. Cervical CT: No acute fracture or malalignment of the cervical spine. Multilevel degenerative changes, with foraminal narrowing worst at the levels of C2-C5 as above. There is canal narrowing at the C6-C7 level secondary to  posterior disc osteophyte complex and posterior canal calcifications. Canal measures 6 mm-7 mm at this site. Maxillofacial CT: No acute fracture. Electronically Signed   By: Gilmer Mor D.O.   On: 03/01/2018 14:56   Ct Cervical Spine Wo Contrast  Result Date: 02/10/2018 CLINICAL DATA:  84 year old male with a history of fall and suspected head injury EXAM: CT HEAD WITHOUT CONTRAST CT MAXILLOFACIAL WITHOUT CONTRAST CT CERVICAL SPINE WITHOUT CONTRAST TECHNIQUE: Multidetector CT imaging of the head, cervical spine, and maxillofacial structures were performed using the standard protocol without intravenous contrast. Multiplanar CT image reconstructions of the cervical spine and maxillofacial structures were also generated. COMPARISON:  None. FINDINGS: CT HEAD FINDINGS Brain: Acute subdural hemorrhage extending from the frontal convexity posteriorly over the parietal lobe. Hemorrhage extends into the middle cranial fossa, subtemporal. The greatest thickness of the subdural component measures 7 mm at the sylvian fissure. Hemorrhage within the middle cranial fossa is partly intraparenchymal of the temporal lobe, extends to the temporal pole, and posteriorly along the petroclival ligament. Hematoma within the middle cranial fossa measures 4.3 cm x 2.2 cm. Partial effacement of the right lateral ventricle. Basal cisterns patent. Left lateral ventricle patent. Third ventricle patent though partially effaced. Suprasellar cisterns patent, with effacement of the right sylvian cistern secondary to blood products. Vascular: Calcifications of the intracranial vasculature. Skull: No acute fracture.  No aggressive bony lesion. Other: No scalp hematoma or radiopaque foreign body. CT MAXILLOFACIAL FINDINGS Osseous: Edentulous mandible. No fracture identified. Edentulous maxilla. No fracture identified. No midface fracture. No orbital fracture. Orbits: Surgical changes of bilateral lens extraction. Otherwise unremarkable orbits.  Sinuses: Trace mucosal disease of the ethmoid air cells and frontal recesses. No mastoid effusion. Soft tissues: No radiopaque foreign body.  No soft tissue swelling. CT CERVICAL SPINE FINDINGS Alignment: Alignment maintained with no subluxation. No significant anterolisthesis/retrolisthesis. Skull base and vertebrae: Skull base unremarkable with no acute fracture. Craniocervical junction maintained. No acute fracture of the cervical vertebral bodies. Soft tissues and spinal canal: No focal soft tissue swelling. No canal hematoma. Carotid calcifications. Disc levels: Degenerative disc disease throughout the cervical spine with disc space narrowing, uncovertebral joint disease, and anterior osteophyte production, which is most pronounced at C5-C6 and C6-C7. Posterior disc osteophyte complex at the C6-C7 level, which is associated with posterior calcification, potentially degenerative or meningioma. This results in narrowing of the canal at this disc level, approximately 6 mm-7 mm. Mild left foraminal narrowing at C2-C3 secondary to uncovertebral joint disease and facet disease. Advanced foraminal narrowing at C3-C4 on the left secondary to uncovertebral joint disease and facet disease. Advanced left foraminal narrowing at C4-C5  and moderate right-sided, secondary to uncovertebral joint disease and facet disease. Upper chest: Unremarkable Other: None IMPRESSION: Head CT: Multicompartmental hemorrhage of the right hemisphere, predominantly subdural component and intraparenchymal hemorrhage of the right temporal lobe. Holo hemispheric subdural hemorrhage extends from the frontal region over the temporal and parietal lobe with the greatest thickness measured at the sylvian fissure, 7 mm. Midline shift at the foramen of Monro measuring 9 mm. Hematoma within the middle cranial fossa continuous with the petroclival ligament measures 4.3 cm x 2.2 cm, with associated early uncal herniation. Preliminary results were called by  telephone at the time of interpretation on 02/23/2018 at 2:41 pm to Dr. Nita Sickle , who verbally acknowledged these results. Cervical CT: No acute fracture or malalignment of the cervical spine. Multilevel degenerative changes, with foraminal narrowing worst at the levels of C2-C5 as above. There is canal narrowing at the C6-C7 level secondary to posterior disc osteophyte complex and posterior canal calcifications. Canal measures 6 mm-7 mm at this site. Maxillofacial CT: No acute fracture. Electronically Signed   By: Gilmer Mor D.O.   On: 03/03/2018 14:56   Ct Maxillofacial Wo Contrast  Result Date: 02/06/2018 CLINICAL DATA:  84 year old male with a history of fall and suspected head injury EXAM: CT HEAD WITHOUT CONTRAST CT MAXILLOFACIAL WITHOUT CONTRAST CT CERVICAL SPINE WITHOUT CONTRAST TECHNIQUE: Multidetector CT imaging of the head, cervical spine, and maxillofacial structures were performed using the standard protocol without intravenous contrast. Multiplanar CT image reconstructions of the cervical spine and maxillofacial structures were also generated. COMPARISON:  None. FINDINGS: CT HEAD FINDINGS Brain: Acute subdural hemorrhage extending from the frontal convexity posteriorly over the parietal lobe. Hemorrhage extends into the middle cranial fossa, subtemporal. The greatest thickness of the subdural component measures 7 mm at the sylvian fissure. Hemorrhage within the middle cranial fossa is partly intraparenchymal of the temporal lobe, extends to the temporal pole, and posteriorly along the petroclival ligament. Hematoma within the middle cranial fossa measures 4.3 cm x 2.2 cm. Partial effacement of the right lateral ventricle. Basal cisterns patent. Left lateral ventricle patent. Third ventricle patent though partially effaced. Suprasellar cisterns patent, with effacement of the right sylvian cistern secondary to blood products. Vascular: Calcifications of the intracranial vasculature.  Skull: No acute fracture.  No aggressive bony lesion. Other: No scalp hematoma or radiopaque foreign body. CT MAXILLOFACIAL FINDINGS Osseous: Edentulous mandible. No fracture identified. Edentulous maxilla. No fracture identified. No midface fracture. No orbital fracture. Orbits: Surgical changes of bilateral lens extraction. Otherwise unremarkable orbits. Sinuses: Trace mucosal disease of the ethmoid air cells and frontal recesses. No mastoid effusion. Soft tissues: No radiopaque foreign body.  No soft tissue swelling. CT CERVICAL SPINE FINDINGS Alignment: Alignment maintained with no subluxation. No significant anterolisthesis/retrolisthesis. Skull base and vertebrae: Skull base unremarkable with no acute fracture. Craniocervical junction maintained. No acute fracture of the cervical vertebral bodies. Soft tissues and spinal canal: No focal soft tissue swelling. No canal hematoma. Carotid calcifications. Disc levels: Degenerative disc disease throughout the cervical spine with disc space narrowing, uncovertebral joint disease, and anterior osteophyte production, which is most pronounced at C5-C6 and C6-C7. Posterior disc osteophyte complex at the C6-C7 level, which is associated with posterior calcification, potentially degenerative or meningioma. This results in narrowing of the canal at this disc level, approximately 6 mm-7 mm. Mild left foraminal narrowing at C2-C3 secondary to uncovertebral joint disease and facet disease. Advanced foraminal narrowing at C3-C4 on the left secondary to uncovertebral joint disease and facet disease. Advanced left  foraminal narrowing at C4-C5 and moderate right-sided, secondary to uncovertebral joint disease and facet disease. Upper chest: Unremarkable Other: None IMPRESSION: Head CT: Multicompartmental hemorrhage of the right hemisphere, predominantly subdural component and intraparenchymal hemorrhage of the right temporal lobe. Holo hemispheric subdural hemorrhage extends from  the frontal region over the temporal and parietal lobe with the greatest thickness measured at the sylvian fissure, 7 mm. Midline shift at the foramen of Monro measuring 9 mm. Hematoma within the middle cranial fossa continuous with the petroclival ligament measures 4.3 cm x 2.2 cm, with associated early uncal herniation. Preliminary results were called by telephone at the time of interpretation on 02-07-2018 at 2:41 pm to Dr. Nita SickleAROLINA Lizmary Nader , who verbally acknowledged these results. Cervical CT: No acute fracture or malalignment of the cervical spine. Multilevel degenerative changes, with foraminal narrowing worst at the levels of C2-C5 as above. There is canal narrowing at the C6-C7 level secondary to posterior disc osteophyte complex and posterior canal calcifications. Canal measures 6 mm-7 mm at this site. Maxillofacial CT: No acute fracture. Electronically Signed   By: Gilmer MorJaime  Wagner D.O.   On: 02-07-2018 14:56     ____________________________________________   PROCEDURES  Procedure(s) performed: None Procedures Critical Care performed:  None ____________________________________________   INITIAL IMPRESSION / ASSESSMENT AND PLAN / ED COURSE   84 y.o. male history of diabetes, hypertension, hyperlipidemia, hypothyroidism who presents for evaluation of mechanical fall which happened 12 hours ago.  Patient arrives a GCS of 12.  CT showing large subdural hematoma with midline shift and possible herniation.  Patient's daughter and son-in-law are at the bedside.  Patient's daughter and son are next of kim.  I had a lengthy conversation with the family about transferring patient to the neuro ICU, intubate for airway protection, and neurosurgical intervention.  Daughter and son have opted for comfort care.  They do not wish the patient be intubated or resuscitated.  They just want patient to be comfortable, they do not want any invasive procedures.  Considering the patient is 84 years old and the  devastating injury that he has sustained I do believe this is at the best interest of the patient as well. Patient was started on morphine for pain. Head of the bed elevated. Family declined BP management or TXA administration. They have requested comfort care only.  I discussed these wishes with Dr. Imogene Burnhen, hospitalist will be admitting the patient.      As part of my medical decision making, I reviewed the following data within the electronic MEDICAL RECORD NUMBER History obtained from family, Nursing notes reviewed and incorporated, Labs reviewed , EKG interpreted , Old chart reviewed, Radiograph reviewed , Notes from prior ED visits and [employer] Controlled Substance Database    Pertinent labs & imaging results that were available during my care of the patient were reviewed by me and considered in my medical decision making (see chart for details).    ____________________________________________   FINAL CLINICAL IMPRESSION(S) / ED DIAGNOSES  Final diagnoses:  Fall, initial encounter  Traumatic subdural hematoma with loss of consciousness, initial encounter (HCC)  Periorbital hematoma of left eye      NEW MEDICATIONS STARTED DURING THIS VISIT:  ED Discharge Orders    None       Note:  This document was prepared using Dragon voice recognition software and may include unintentional dictation errors.    Nita SickleVeronese, [place], MD 2017/10/12 [phone]

## 2018-03-06 NOTE — Progress Notes (Signed)
Patient running temp, family declined to have it treated at this time, daughter  will let us know if they change their minds

## 2018-03-06 NOTE — ED Notes (Signed)
Patient states he sees same as prior to fall, noted to have poor vision.

## 2018-03-06 NOTE — H&P (Signed)
[facility] [place] [facility]   PATIENT NAME: Corey Evans    MR#:  [id number]  DATE OF BIRTH:  [birth year]  DATE OF ADMISSION:  04/04/2018  PRIMARY CARE PHYSICIAN: Gracelyn Nurse, MD   REQUESTING/REFERRING PHYSICIAN: Dr. Don Perking.  CHIEF COMPLAINT:   Chief Complaint  Patient presents with  . Head Injury   Head injury due to fall today HISTORY OF PRESENT ILLNESS:  Corey Evans  is a 84 y.o. male with a known history as below.  The patient fell by accident with face down on the ground at home today.  Initially he was oriented but the patient is unresponsive to verbal stimuli but denies see the ED physician, CAT scan of the head show multiple intracranial hemorrhage with midline shift.  ED physician planned to call neurosurgeon and [facility] for possible transfer, but the patient's daughter decided comfort care.  ED physician request admission. PAST MEDICAL HISTORY:   Past Medical History:  Diagnosis Date  . Anemia   . 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 Diabetes mellitus without complication (HCC)   . Hyperlipidemia   . Hypertension   . Hypothyroidism   . Neuropathy    left eye s/p small "stroke in eye" recently   . Occlusion and stenosis of carotid artery without mention of cerebral infarction   . Wears dentures    full upper and lower    PAST SURGICAL HISTORY:   Past Surgical History:  Procedure Laterality Date  . CAROTID ENDARTERECTOMY Right 1998  . CATARACT EXTRACTION W/PHACO Right 08/14/2016   Procedure: CATARACT EXTRACTION PHACO AND INTRAOCULAR LENS PLACEMENT (IOC)  Complicated Right Diabetic;  Surgeon: Lockie Mola, MD;  Location: [facility];  Service: Ophthalmology;  Laterality: Right;  Diabetic - oral meds Malyugin  . CATARACT EXTRACTION W/PHACO Left 03/24/2017   Procedure: CATARACT EXTRACTION PHACO AND INTRAOCULAR LENS PLACEMENT (IOC) COMPLICATED LEFT DIABETIC;  Surgeon:  Lockie Mola, MD;  Location: [facility];  Service: Ophthalmology;  Laterality: Left;  Diabetic - oral meds  . NASAL FRACTURE SURGERY  [id number]  . SIGMOIDOSCOPY  2005    SOCIAL HISTORY:   Social History   Tobacco Use  . Smoking status: Former Smoker    Packs/day: 2.00    Years: 30.00    Pack years: 60.00    Last attempt to quit: 10/08/1976    Years since quitting: 41.4  . Smokeless tobacco: Never Used  Substance Use Topics  . Alcohol use: No    FAMILY HISTORY:   Family History  Problem Relation Age of Onset  . Hypertension Father   . Congestive Heart Failure Sister   . Diabetes Brother     DRUG ALLERGIES:  No Known Allergies  REVIEW OF SYSTEMS:   Review of Systems  Unable to perform ROS: Mental status change    MEDICATIONS AT HOME:   Prior to Admission medications   Medication Sig Start Date End Date Taking? Authorizing Provider  amLODipine (NORVASC) 5 MG tablet Take 5 mg by mouth daily.   Yes [provider]  aspirin 81 MG tablet Take 81 mg by mouth daily.   Yes [provider]  Cholecalciferol 50 MCG (2000 UT) TABS Take 4,000 Units by mouth daily.    Yes [provider]  glimepiride (AMARYL) 4 MG tablet Take 4 mg by mouth daily with breakfast.    Yes [provider]  levothyroxine (SYNTHROID, LEVOTHROID) 50 MCG tablet Take  50 mcg by mouth daily before breakfast.    Yes [provider]  losartan (COZAAR) 50 MG tablet Take 50 mg by mouth daily.   Yes [provider]  metFORMIN (GLUCOPHAGE) 500 MG tablet Take 1,000 mg by mouth 2 (two) times daily with a meal.    Yes [provider]  metoprolol succinate (TOPROL-XL) 25 MG 24 hr tablet Take 25 mg by mouth daily.    Yes [provider]  Multiple Vitamin (MULTIVITAMIN) tablet Take 1 tablet by mouth daily.   Yes [provider]  Omega-3 Fatty Acids (FISH OIL) 1000 MG CAPS Take 1 capsule by mouth 2 (two) times daily.    Yes  [provider]  pioglitazone (ACTOS) 30 MG tablet Take 30 mg by mouth daily.   Yes [provider]  predniSONE (DELTASONE) 5 MG tablet Take 10 mg by mouth daily.    Yes [provider]      VITAL SIGNS:  Blood pressure (!) 176/76, pulse 87, temperature 98 F (36.7 C), temperature source Oral, resp. rate 17, height 5\' 10"  (1.778 m), weight 71.2 kg, SpO2 92 %.  PHYSICAL EXAMINATION:  Physical Exam  GENERAL:  84 y.o.-year-old patient lying in the bed with no acute distress.  EYES: Closed.  Hematoma around left orbital area HEENT: Hematoma on front head and orbital area. facial ecchymosis and swelling. NECK:  Supple, no jugular venous distention. No thyroid enlargement, no tenderness.  LUNGS: Normal breath sounds bilaterally, no wheezing, rales,rhonchi or crepitation. No use of accessory muscles of respiration.  CARDIOVASCULAR: S1, S2 normal. No murmurs, rubs, or gallops.  ABDOMEN: Soft, nontender, nondistended. Bowel sounds present. EXTREMITIES: No pedal edema, cyanosis, or clubbing.  NEUROLOGIC: Unable to exam. PSYCHIATRIC: The patient is unresponsive to verbal stimuli. SKIN: No obvious rash, lesion, or ulcer.   LABORATORY PANEL:   CBC Recent Labs  Lab 03/02/2018 1448  WBC 38.2*  HGB 10.9*  HCT 33.7*  PLT 65*   ------------------------------------------------------------------------------------------------------------------  Chemistries  Recent Labs  Lab 02/07/2018 1448  NA 133*  K 4.0  CL 96*  CO2 24  GLUCOSE 357*  BUN 26*  CREATININE 1.44*  CALCIUM 9.5   ------------------------------------------------------------------------------------------------------------------  Cardiac Enzymes No results for input(s): TROPONINI in the last 168 hours. ------------------------------------------------------------------------------------------------------------------  RADIOLOGY:  Ct Head Wo Contrast  Result Date: 02/25/2018 CLINICAL DATA:   84 year old male with a history of fall and suspected head injury EXAM: CT HEAD WITHOUT CONTRAST CT MAXILLOFACIAL WITHOUT CONTRAST CT CERVICAL SPINE WITHOUT CONTRAST TECHNIQUE: Multidetector CT imaging of the head, cervical spine, and maxillofacial structures were performed using the standard protocol without intravenous contrast. Multiplanar CT image reconstructions of the cervical spine and maxillofacial structures were also generated. COMPARISON:  None. FINDINGS: CT HEAD FINDINGS Brain: Acute subdural hemorrhage extending from the frontal convexity posteriorly over the parietal lobe. Hemorrhage extends into the middle cranial fossa, subtemporal. The greatest thickness of the subdural component measures 7 mm at the sylvian fissure. Hemorrhage within the middle cranial fossa is partly intraparenchymal of the temporal lobe, extends to the temporal pole, and posteriorly along the petroclival ligament. Hematoma within the middle cranial fossa measures 4.3 cm x 2.2 cm. Partial effacement of the right lateral ventricle. Basal cisterns patent. Left lateral ventricle patent. Third ventricle patent though partially effaced. Suprasellar cisterns patent, with effacement of the right sylvian cistern secondary to blood products. Vascular: Calcifications of the intracranial vasculature. Skull: No acute fracture.  No aggressive bony lesion. Other: No scalp hematoma or radiopaque foreign body.  CT MAXILLOFACIAL FINDINGS Osseous: Edentulous mandible. No fracture identified. Edentulous maxilla. No fracture identified. No midface fracture. No orbital fracture. Orbits: Surgical changes of bilateral lens extraction. Otherwise unremarkable orbits. Sinuses: Trace mucosal disease of the ethmoid air cells and frontal recesses. No mastoid effusion. Soft tissues: No radiopaque foreign body.  No soft tissue swelling. CT CERVICAL SPINE FINDINGS Alignment: Alignment maintained with no subluxation. No significant anterolisthesis/retrolisthesis.  Skull base and vertebrae: Skull base unremarkable with no acute fracture. Craniocervical junction maintained. No acute fracture of the cervical vertebral bodies. Soft tissues and spinal canal: No focal soft tissue swelling. No canal hematoma. Carotid calcifications. Disc levels: Degenerative disc disease throughout the cervical spine with disc space narrowing, uncovertebral joint disease, and anterior osteophyte production, which is most pronounced at C5-C6 and C6-C7. Posterior disc osteophyte complex at the C6-C7 level, which is associated with posterior calcification, potentially degenerative or meningioma. This results in narrowing of the canal at this disc level, approximately 6 mm-7 mm. Mild left foraminal narrowing at C2-C3 secondary to uncovertebral joint disease and facet disease. Advanced foraminal narrowing at C3-C4 on the left secondary to uncovertebral joint disease and facet disease. Advanced left foraminal narrowing at C4-C5 and moderate right-sided, secondary to uncovertebral joint disease and facet disease. Upper chest: Unremarkable Other: None IMPRESSION: Head CT: Multicompartmental hemorrhage of the right hemisphere, predominantly subdural component and intraparenchymal hemorrhage of the right temporal lobe. Holo hemispheric subdural hemorrhage extends from the frontal region over the temporal and parietal lobe with the greatest thickness measured at the sylvian fissure, 7 mm. Midline shift at the foramen of Monro measuring 9 mm. Hematoma within the middle cranial fossa continuous with the petroclival ligament measures 4.3 cm x 2.2 cm, with associated early uncal herniation. Preliminary results were called by telephone at the time of interpretation on 02/28/2018 at 2:41 pm to Dr. Nita Sickle , who verbally acknowledged these results. Cervical CT: No acute fracture or malalignment of the cervical spine. Multilevel degenerative changes, with foraminal narrowing worst at the levels of C2-C5 as  above. There is canal narrowing at the C6-C7 level secondary to posterior disc osteophyte complex and posterior canal calcifications. Canal measures 6 mm-7 mm at this site. Maxillofacial CT: No acute fracture. Electronically Signed   By: Gilmer Mor D.O.   On: 02/12/2018 14:56   Ct Cervical Spine Wo Contrast  Result Date: 02/24/2018 CLINICAL DATA:  84 year old male with a history of fall and suspected head injury EXAM: CT HEAD WITHOUT CONTRAST CT MAXILLOFACIAL WITHOUT CONTRAST CT CERVICAL SPINE WITHOUT CONTRAST TECHNIQUE: Multidetector CT imaging of the head, cervical spine, and maxillofacial structures were performed using the standard protocol without intravenous contrast. Multiplanar CT image reconstructions of the cervical spine and maxillofacial structures were also generated. COMPARISON:  None. FINDINGS: CT HEAD FINDINGS Brain: Acute subdural hemorrhage extending from the frontal convexity posteriorly over the parietal lobe. Hemorrhage extends into the middle cranial fossa, subtemporal. The greatest thickness of the subdural component measures 7 mm at the sylvian fissure. Hemorrhage within the middle cranial fossa is partly intraparenchymal of the temporal lobe, extends to the temporal pole, and posteriorly along the petroclival ligament. Hematoma within the middle cranial fossa measures 4.3 cm x 2.2 cm. Partial effacement of the right lateral ventricle. Basal cisterns patent. Left lateral ventricle patent. Third ventricle patent though partially effaced. Suprasellar cisterns patent, with effacement of the right sylvian cistern secondary to blood products. Vascular: Calcifications of the intracranial vasculature. Skull: No acute fracture.  No aggressive bony lesion. Other: No scalp  hematoma or radiopaque foreign body. CT MAXILLOFACIAL FINDINGS Osseous: Edentulous mandible. No fracture identified. Edentulous maxilla. No fracture identified. No midface fracture. No orbital fracture. Orbits: Surgical  changes of bilateral lens extraction. Otherwise unremarkable orbits. Sinuses: Trace mucosal disease of the ethmoid air cells and frontal recesses. No mastoid effusion. Soft tissues: No radiopaque foreign body.  No soft tissue swelling. CT CERVICAL SPINE FINDINGS Alignment: Alignment maintained with no subluxation. No significant anterolisthesis/retrolisthesis. Skull base and vertebrae: Skull base unremarkable with no acute fracture. Craniocervical junction maintained. No acute fracture of the cervical vertebral bodies. Soft tissues and spinal canal: No focal soft tissue swelling. No canal hematoma. Carotid calcifications. Disc levels: Degenerative disc disease throughout the cervical spine with disc space narrowing, uncovertebral joint disease, and anterior osteophyte production, which is most pronounced at C5-C6 and C6-C7. Posterior disc osteophyte complex at the C6-C7 level, which is associated with posterior calcification, potentially degenerative or meningioma. This results in narrowing of the canal at this disc level, approximately 6 mm-7 mm. Mild left foraminal narrowing at C2-C3 secondary to uncovertebral joint disease and facet disease. Advanced foraminal narrowing at C3-C4 on the left secondary to uncovertebral joint disease and facet disease. Advanced left foraminal narrowing at C4-C5 and moderate right-sided, secondary to uncovertebral joint disease and facet disease. Upper chest: Unremarkable Other: None IMPRESSION: Head CT: Multicompartmental hemorrhage of the right hemisphere, predominantly subdural component and intraparenchymal hemorrhage of the right temporal lobe. Holo hemispheric subdural hemorrhage extends from the frontal region over the temporal and parietal lobe with the greatest thickness measured at the sylvian fissure, 7 mm. Midline shift at the foramen of Monro measuring 9 mm. Hematoma within the middle cranial fossa continuous with the petroclival ligament measures 4.3 cm x 2.2 cm, with  associated early uncal herniation. Preliminary results were called by telephone at the time of interpretation on 03-22-18 at 2:41 pm to Dr. Nita Sickle , who verbally acknowledged these results. Cervical CT: No acute fracture or malalignment of the cervical spine. Multilevel degenerative changes, with foraminal narrowing worst at the levels of C2-C5 as above. There is canal narrowing at the C6-C7 level secondary to posterior disc osteophyte complex and posterior canal calcifications. Canal measures 6 mm-7 mm at this site. Maxillofacial CT: No acute fracture. Electronically Signed   By: Gilmer Mor D.O.   On: 03-22-18 14:56   Ct Maxillofacial Wo Contrast  Result Date: 03-22-2018 CLINICAL DATA:  84 year old male with a history of fall and suspected head injury EXAM: CT HEAD WITHOUT CONTRAST CT MAXILLOFACIAL WITHOUT CONTRAST CT CERVICAL SPINE WITHOUT CONTRAST TECHNIQUE: Multidetector CT imaging of the head, cervical spine, and maxillofacial structures were performed using the standard protocol without intravenous contrast. Multiplanar CT image reconstructions of the cervical spine and maxillofacial structures were also generated. COMPARISON:  None. FINDINGS: CT HEAD FINDINGS Brain: Acute subdural hemorrhage extending from the frontal convexity posteriorly over the parietal lobe. Hemorrhage extends into the middle cranial fossa, subtemporal. The greatest thickness of the subdural component measures 7 mm at the sylvian fissure. Hemorrhage within the middle cranial fossa is partly intraparenchymal of the temporal lobe, extends to the temporal pole, and posteriorly along the petroclival ligament. Hematoma within the middle cranial fossa measures 4.3 cm x 2.2 cm. Partial effacement of the right lateral ventricle. Basal cisterns patent. Left lateral ventricle patent. Third ventricle patent though partially effaced. Suprasellar cisterns patent, with effacement of the right sylvian cistern secondary to blood  products. Vascular: Calcifications of the intracranial vasculature. Skull: No acute fracture.  No aggressive  bony lesion. Other: No scalp hematoma or radiopaque foreign body. CT MAXILLOFACIAL FINDINGS Osseous: Edentulous mandible. No fracture identified. Edentulous maxilla. No fracture identified. No midface fracture. No orbital fracture. Orbits: Surgical changes of bilateral lens extraction. Otherwise unremarkable orbits. Sinuses: Trace mucosal disease of the ethmoid air cells and frontal recesses. No mastoid effusion. Soft tissues: No radiopaque foreign body.  No soft tissue swelling. CT CERVICAL SPINE FINDINGS Alignment: Alignment maintained with no subluxation. No significant anterolisthesis/retrolisthesis. Skull base and vertebrae: Skull base unremarkable with no acute fracture. Craniocervical junction maintained. No acute fracture of the cervical vertebral bodies. Soft tissues and spinal canal: No focal soft tissue swelling. No canal hematoma. Carotid calcifications. Disc levels: Degenerative disc disease throughout the cervical spine with disc space narrowing, uncovertebral joint disease, and anterior osteophyte production, which is most pronounced at C5-C6 and C6-C7. Posterior disc osteophyte complex at the C6-C7 level, which is associated with posterior calcification, potentially degenerative or meningioma. This results in narrowing of the canal at this disc level, approximately 6 mm-7 mm. Mild left foraminal narrowing at C2-C3 secondary to uncovertebral joint disease and facet disease. Advanced foraminal narrowing at C3-C4 on the left secondary to uncovertebral joint disease and facet disease. Advanced left foraminal narrowing at C4-C5 and moderate right-sided, secondary to uncovertebral joint disease and facet disease. Upper chest: Unremarkable Other: None IMPRESSION: Head CT: Multicompartmental hemorrhage of the right hemisphere, predominantly subdural component and intraparenchymal hemorrhage of the  right temporal lobe. Holo hemispheric subdural hemorrhage extends from the frontal region over the temporal and parietal lobe with the greatest thickness measured at the sylvian fissure, 7 mm. Midline shift at the foramen of Monro measuring 9 mm. Hematoma within the middle cranial fossa continuous with the petroclival ligament measures 4.3 cm x 2.2 cm, with associated early uncal herniation. Preliminary results were called by telephone at the time of interpretation on 03/01/2018 at 2:41 pm to Dr. Nita SickleAROLINA VERONESE , who verbally acknowledged these results. Cervical CT: No acute fracture or malalignment of the cervical spine. Multilevel degenerative changes, with foraminal narrowing worst at the levels of C2-C5 as above. There is canal narrowing at the C6-C7 level secondary to posterior disc osteophyte complex and posterior canal calcifications. Canal measures 6 mm-7 mm at this site. Maxillofacial CT: No acute fracture. Electronically Signed   By: Gilmer MorJaime  Wagner D.O.   On: 02/07/2018 14:56      IMPRESSION AND PLAN:   Intracranial hemorrhage due to head injury after fall. Hyponatremia. Acute renal failure Leukocytosis and thrombocytopenia. Patient will be admitted to medical floor. Comfort care protocol. The patient may die any time.  If the patient survive, he will need hospice care at home or hospice home.  All the records are reviewed and case discussed with ED provider. Management plans discussed with the patient's daughter and son-in-law and they are in agreement.  CODE STATUS: DNR.  TOTAL TIME TAKING CARE OF THIS PATIENT:  35 minutes.    Shaune PollackQing Wil Slape M.D on 02/19/2018 at 3:36 PM  Between 7am to 6pm - Pager - [phone]  After 6pm go to [email or web address] - Social research officer, [email or web address] [email or web address]  [facility] [place] [facility]  Office  [phone]  CC: Primary care physician; Gracelyn NurseJohnston, John D, MD   Note: This dictation was prepared with Dragon dictation along with smaller phrase technology. Any  transcriptional errors that result from this process are unin

## 2018-03-06 NOTE — ED Notes (Signed)
Restless, IV start and blood draw, keeps eyes closed.

## 2018-03-08 NOTE — Progress Notes (Signed)
[employer] [facility] [place] [facility]   PATIENT NAME: Corey Evans    MR#:  [id number]  DATE OF BIRTH:  12/17/1927  SUBJECTIVE:  CHIEF COMPLAINT:   Chief Complaint  Patient presents with  . Head Injury  Patient seen and evaluated by me this morning Has labored breathing Unresponsive  REVIEW OF SYSTEMS:    ROS Could not be obtained as patient is encephalopathic Febrile  DRUG ALLERGIES:  No Known Allergies  VITALS:  Blood pressure (!) 172/73, pulse (!) 110, temperature (!) 101.4 F (38.6 C), temperature source Axillary, resp. rate 18, height 5\' 11"  (1.803 m), weight 68.9 kg, SpO2 93 %.  PHYSICAL EXAMINATION:   Physical Exam  GENERAL:  84 y.o.-year-old patient lying in the bed in respiratory distress on oxygen via nasal cannula EYES: Pupils equal, round, sluggishly reactive to light and accommodation. No scleral icterus. Extraocular muscles intact.  HEENT: Head atraumatic, normocephalic. Oropharynx dry and nasopharynx clear.  NECK:  Supple, no jugular venous distention. No thyroid enlargement, no tenderness.  LUNGS: Decreased breath sounds bilaterally, bilateral Rales heard CARDIOVASCULAR: S1, S2 tachycardic. No murmurs, rubs, or gallops.  ABDOMEN: Soft, nontender, nondistended. Bowel sounds present. No organomegaly or mass.  EXTREMITIES: No cyanosis, clubbing or edema b/l.    NEUROLOGIC: Patient is lying on the bed, unresponsive PSYCHIATRIC: The patient is alert and oriented x none SKIN: No obvious rash, lesion, or ulcer.   LABORATORY PANEL:   CBC Recent Labs  Lab Feb 10, 2018 1448  WBC 38.2*  HGB 10.9*  HCT 33.7*  PLT 65*   ------------------------------------------------------------------------------------------------------------------ Chemistries  Recent Labs  Lab Feb 10, 2018 1448  NA 133*  K 4.0  CL 96*  CO2 24  GLUCOSE 357*  BUN 26*  CREATININE 1.44*  CALCIUM 9.5    ------------------------------------------------------------------------------------------------------------------  Cardiac Enzymes No results for input(s): TROPONINI in the last 168 hours. ------------------------------------------------------------------------------------------------------------------  RADIOLOGY:  Ct Head Wo Contrast  Result Date: 2018-03-21 CLINICAL DATA:  84 year old male with a history of fall and suspected head injury EXAM: CT HEAD WITHOUT CONTRAST CT MAXILLOFACIAL WITHOUT CONTRAST CT CERVICAL SPINE WITHOUT CONTRAST TECHNIQUE: Multidetector CT imaging of the head, cervical spine, and maxillofacial structures were performed using the standard protocol without intravenous contrast. Multiplanar CT image reconstructions of the cervical spine and maxillofacial structures were also generated. COMPARISON:  None. FINDINGS: CT HEAD FINDINGS Brain: Acute subdural hemorrhage extending from the frontal convexity posteriorly over the parietal lobe. Hemorrhage extends into the middle cranial fossa, subtemporal. The greatest thickness of the subdural component measures 7 mm at the sylvian fissure. Hemorrhage within the middle cranial fossa is partly intraparenchymal of the temporal lobe, extends to the temporal pole, and posteriorly along the petroclival ligament. Hematoma within the middle cranial fossa measures 4.3 cm x 2.2 cm. Partial effacement of the right lateral ventricle. Basal cisterns patent. Left lateral ventricle patent. Third ventricle patent though partially effaced. Suprasellar cisterns patent, with effacement of the right sylvian cistern secondary to blood products. Vascular: Calcifications of the intracranial vasculature. Skull: No acute fracture.  No aggressive bony lesion. Other: No scalp hematoma or radiopaque foreign body. CT MAXILLOFACIAL FINDINGS Osseous: Edentulous mandible. No fracture identified. Edentulous maxilla. No fracture identified. No midface fracture. No  orbital fracture. Orbits: Surgical changes of bilateral lens extraction. Otherwise unremarkable orbits. Sinuses: Trace mucosal disease of the ethmoid air cells and frontal recesses. No mastoid effusion. Soft tissues: No radiopaque foreign body.  No soft tissue swelling. CT CERVICAL SPINE FINDINGS Alignment: Alignment maintained with no subluxation. No significant anterolisthesis/retrolisthesis.  Skull base and vertebrae: Skull base unremarkable with no acute fracture. Craniocervical junction maintained. No acute fracture of the cervical vertebral bodies. Soft tissues and spinal canal: No focal soft tissue swelling. No canal hematoma. Carotid calcifications. Disc levels: Degenerative disc disease throughout the cervical spine with disc space narrowing, uncovertebral joint disease, and anterior osteophyte production, which is most pronounced at C5-C6 and C6-C7. Posterior disc osteophyte complex at the C6-C7 level, which is associated with posterior calcification, potentially degenerative or meningioma. This results in narrowing of the canal at this disc level, approximately 6 mm-7 mm. Mild left foraminal narrowing at C2-C3 secondary to uncovertebral joint disease and facet disease. Advanced foraminal narrowing at C3-C4 on the left secondary to uncovertebral joint disease and facet disease. Advanced left foraminal narrowing at C4-C5 and moderate right-sided, secondary to uncovertebral joint disease and facet disease. Upper chest: Unremarkable Other: None IMPRESSION: Head CT: Multicompartmental hemorrhage of the right hemisphere, predominantly subdural component and intraparenchymal hemorrhage of the right temporal lobe. Holo hemispheric subdural hemorrhage extends from the frontal region over the temporal and parietal lobe with the greatest thickness measured at the sylvian fissure, 7 mm. Midline shift at the foramen of Monro measuring 9 mm. Hematoma within the middle cranial fossa continuous with the petroclival  ligament measures 4.3 cm x 2.2 cm, with associated Corey uncal herniation. Preliminary results were called by telephone at the time of interpretation on 03/08/18 at 2:41 pm to Dr. Nita Sickle , who verbally acknowledged these results. Cervical CT: No acute fracture or malalignment of the cervical spine. Multilevel degenerative changes, with foraminal narrowing worst at the levels of C2-C5 as above. There is canal narrowing at the C6-C7 level secondary to posterior disc osteophyte complex and posterior canal calcifications. Canal measures 6 mm-7 mm at this site. Maxillofacial CT: No acute fracture. Electronically Signed   By: Gilmer Mor D.O.   On: 03/08/18 14:56   Ct Cervical Spine Wo Contrast  Result Date: 03-08-2018 CLINICAL DATA:  84 year old male with a history of fall and suspected head injury EXAM: CT HEAD WITHOUT CONTRAST CT MAXILLOFACIAL WITHOUT CONTRAST CT CERVICAL SPINE WITHOUT CONTRAST TECHNIQUE: Multidetector CT imaging of the head, cervical spine, and maxillofacial structures were performed using the standard protocol without intravenous contrast. Multiplanar CT image reconstructions of the cervical spine and maxillofacial structures were also generated. COMPARISON:  None. FINDINGS: CT HEAD FINDINGS Brain: Acute subdural hemorrhage extending from the frontal convexity posteriorly over the parietal lobe. Hemorrhage extends into the middle cranial fossa, subtemporal. The greatest thickness of the subdural component measures 7 mm at the sylvian fissure. Hemorrhage within the middle cranial fossa is partly intraparenchymal of the temporal lobe, extends to the temporal pole, and posteriorly along the petroclival ligament. Hematoma within the middle cranial fossa measures 4.3 cm x 2.2 cm. Partial effacement of the right lateral ventricle. Basal cisterns patent. Left lateral ventricle patent. Third ventricle patent though partially effaced. Suprasellar cisterns patent, with effacement of the  right sylvian cistern secondary to blood products. Vascular: Calcifications of the intracranial vasculature. Skull: No acute fracture.  No aggressive bony lesion. Other: No scalp hematoma or radiopaque foreign body. CT MAXILLOFACIAL FINDINGS Osseous: Edentulous mandible. No fracture identified. Edentulous maxilla. No fracture identified. No midface fracture. No orbital fracture. Orbits: Surgical changes of bilateral lens extraction. Otherwise unremarkable orbits. Sinuses: Trace mucosal disease of the ethmoid air cells and frontal recesses. No mastoid effusion. Soft tissues: No radiopaque foreign body.  No soft tissue swelling. CT CERVICAL SPINE FINDINGS Alignment: Alignment maintained with  no subluxation. No significant anterolisthesis/retrolisthesis. Skull base and vertebrae: Skull base unremarkable with no acute fracture. Craniocervical junction maintained. No acute fracture of the cervical vertebral bodies. Soft tissues and spinal canal: No focal soft tissue swelling. No canal hematoma. Carotid calcifications. Disc levels: Degenerative disc disease throughout the cervical spine with disc space narrowing, uncovertebral joint disease, and anterior osteophyte production, which is most pronounced at C5-C6 and C6-C7. Posterior disc osteophyte complex at the C6-C7 level, which is associated with posterior calcification, potentially degenerative or meningioma. This results in narrowing of the canal at this disc level, approximately 6 mm-7 mm. Mild left foraminal narrowing at C2-C3 secondary to uncovertebral joint disease and facet disease. Advanced foraminal narrowing at C3-C4 on the left secondary to uncovertebral joint disease and facet disease. Advanced left foraminal narrowing at C4-C5 and moderate right-sided, secondary to uncovertebral joint disease and facet disease. Upper chest: Unremarkable Other: None IMPRESSION: Head CT: Multicompartmental hemorrhage of the right hemisphere, predominantly subdural component  and intraparenchymal hemorrhage of the right temporal lobe. Holo hemispheric subdural hemorrhage extends from the frontal region over the temporal and parietal lobe with the greatest thickness measured at the sylvian fissure, 7 mm. Midline shift at the foramen of Monro measuring 9 mm. Hematoma within the middle cranial fossa continuous with the petroclival ligament measures 4.3 cm x 2.2 cm, with associated Corey uncal herniation. Preliminary results were called by telephone at the time of interpretation on 02/12/2018 at 2:41 pm to Dr. Nita Sickle , who verbally acknowledged these results. Cervical CT: No acute fracture or malalignment of the cervical spine. Multilevel degenerative changes, with foraminal narrowing worst at the levels of C2-C5 as above. There is canal narrowing at the C6-C7 level secondary to posterior disc osteophyte complex and posterior canal calcifications. Canal measures 6 mm-7 mm at this site. Maxillofacial CT: No acute fracture. Electronically Signed   By: Gilmer Mor D.O.   On: 03/05/2018 14:56   Ct Maxillofacial Wo Contrast  Result Date: 02/07/2018 CLINICAL DATA:  84 year old male with a history of fall and suspected head injury EXAM: CT HEAD WITHOUT CONTRAST CT MAXILLOFACIAL WITHOUT CONTRAST CT CERVICAL SPINE WITHOUT CONTRAST TECHNIQUE: Multidetector CT imaging of the head, cervical spine, and maxillofacial structures were performed using the standard protocol without intravenous contrast. Multiplanar CT image reconstructions of the cervical spine and maxillofacial structures were also generated. COMPARISON:  None. FINDINGS: CT HEAD FINDINGS Brain: Acute subdural hemorrhage extending from the frontal convexity posteriorly over the parietal lobe. Hemorrhage extends into the middle cranial fossa, subtemporal. The greatest thickness of the subdural component measures 7 mm at the sylvian fissure. Hemorrhage within the middle cranial fossa is partly intraparenchymal of the temporal  lobe, extends to the temporal pole, and posteriorly along the petroclival ligament. Hematoma within the middle cranial fossa measures 4.3 cm x 2.2 cm. Partial effacement of the right lateral ventricle. Basal cisterns patent. Left lateral ventricle patent. Third ventricle patent though partially effaced. Suprasellar cisterns patent, with effacement of the right sylvian cistern secondary to blood products. Vascular: Calcifications of the intracranial vasculature. Skull: No acute fracture.  No aggressive bony lesion. Other: No scalp hematoma or radiopaque foreign body. CT MAXILLOFACIAL FINDINGS Osseous: Edentulous mandible. No fracture identified. Edentulous maxilla. No fracture identified. No midface fracture. No orbital fracture. Orbits: Surgical changes of bilateral lens extraction. Otherwise unremarkable orbits. Sinuses: Trace mucosal disease of the ethmoid air cells and frontal recesses. No mastoid effusion. Soft tissues: No radiopaque foreign body.  No soft tissue swelling. CT CERVICAL SPINE FINDINGS  Alignment: Alignment maintained with no subluxation. No significant anterolisthesis/retrolisthesis. Skull base and vertebrae: Skull base unremarkable with no acute fracture. Craniocervical junction maintained. No acute fracture of the cervical vertebral bodies. Soft tissues and spinal canal: No focal soft tissue swelling. No canal hematoma. Carotid calcifications. Disc levels: Degenerative disc disease throughout the cervical spine with disc space narrowing, uncovertebral joint disease, and anterior osteophyte production, which is most pronounced at C5-C6 and C6-C7. Posterior disc osteophyte complex at the C6-C7 level, which is associated with posterior calcification, potentially degenerative or meningioma. This results in narrowing of the canal at this disc level, approximately 6 mm-7 mm. Mild left foraminal narrowing at C2-C3 secondary to uncovertebral joint disease and facet disease. Advanced foraminal narrowing  at C3-C4 on the left secondary to uncovertebral joint disease and facet disease. Advanced left foraminal narrowing at C4-C5 and moderate right-sided, secondary to uncovertebral joint disease and facet disease. Upper chest: Unremarkable Other: None IMPRESSION: Head CT: Multicompartmental hemorrhage of the right hemisphere, predominantly subdural component and intraparenchymal hemorrhage of the right temporal lobe. Holo hemispheric subdural hemorrhage extends from the frontal region over the temporal and parietal lobe with the greatest thickness measured at the sylvian fissure, 7 mm. Midline shift at the foramen of Monro measuring 9 mm. Hematoma within the middle cranial fossa continuous with the petroclival ligament measures 4.3 cm x 2.2 cm, with associated Corey uncal herniation. Preliminary results were called by telephone at the time of interpretation on 02/18/2018 at 2:41 pm to Dr. Nita Sickle , who verbally acknowledged these results. Cervical CT: No acute fracture or malalignment of the cervical spine. Multilevel degenerative changes, with foraminal narrowing worst at the levels of C2-C5 as above. There is canal narrowing at the C6-C7 level secondary to posterior disc osteophyte complex and posterior canal calcifications. Canal measures 6 mm-7 mm at this site. Maxillofacial CT: No acute fracture. Electronically Signed   By: Gilmer Mor D.O.   On: 02/17/2018 14:56     ASSESSMENT AND PLAN:  84 year old male patient with history of diabetes mellitus type 2, hypertension, hyperlipidemia, hypothyroidism, neuropathy currently under hospitalist service for fall and head injury  -Acute intracranial hemorrhage Patient is comfort care Family does not want any aggressive intervention and neurosurgery On supportive care  -Acute respiratory distress with hypoxia Oxygen via nasal cannula Comfort measures instituted  -Leukocytosis and thrombocytopenia Family does not want any active  treatments  -Accidental fall  -Electrolyte abnormalities  -Poor prognosis Family aware of medical condition treatment plan and poor prognosis   All the records are reviewed and case discussed with Care Management/Social Worker. Management plans discussed with the patient, family and they are in agreement.  CODE STATUS: DNR  DVT Prophylaxis: SCDs  TOTAL TIME TAKING CARE OF THIS PATIENT: 33 minutes.   POSSIBLE D/C IN 1 DAYS, DEPENDING ON CLINICAL CONDITION.  Ihor Austin M.D on March 21, 2018 at 10:33 AM  Between 7am to 6pm - Pager - [phone]  After 6pm go to [email or web address] - password [email or web address]  [facility]  Office  [phone]  CC: Primary care physician; Gracelyn Nurse, MD  Note: This dictation was prepared with Dragon dictation along with smaller phrase technology. Any transcriptional errors that result from this process are unintentional.

## 2018-03-08 NOTE — Death Summary Note (Signed)
Date of death 03/06/2018  Cause of death 1.  Intracranial hemorrhage 2.  Acute encephalopathy 3.  Acute cardiorespiratory failure 4.  Acute hypoxic respiratory failure  Brief history of present illness and course of hospital stay 84 year old male patient with history of anemia, type 2 diabetes mellitus, hyperlipidemia, hypertension, hypothyroidism, neuropathy was admitted for fall and head injury.  Patient had a intracranial hemorrhage with midline shift.  Patient's family did not want any aggressive measures such as neurosurgery and intervention.  They opted only for comfort care.  Patient completely encephalopathic and unresponsive.  Family did not want any IV fluids or pressor medications.  Patient is DNR by CODE STATUS.  Was admitted to medical floor and comfort measures were instituted.  Patient expired on 03/06/2018.  Family has been updated.

## 2018-03-08 DEATH — deceased

## 2019-09-26 IMAGING — CT CT CERVICAL SPINE W/O CM
4 of 11 series · 7 of 34 positions shown, 8 images · non-contrast
Comparison: None.

CLINICAL DATA: 89-year-old male with a history of fall and
suspected head injury

EXAM:
CT HEAD WITHOUT CONTRAST
CT MAXILLOFACIAL WITHOUT CONTRAST
CT CERVICAL SPINE WITHOUT CONTRAST
TECHNIQUE: Multidetector CT imaging of the head, cervical spine, and
maxillofacial structures were performed using the standard protocol
without intravenous contrast. Multiplanar CT image reconstructions
of the cervical spine and maxillofacial structures were also
generated.

[Series 5: max soft · axial · 0.35mm/px · z∈[-122,-66]mm · 2 of 85 slices shown]
[im 29/85  soft-tissue]
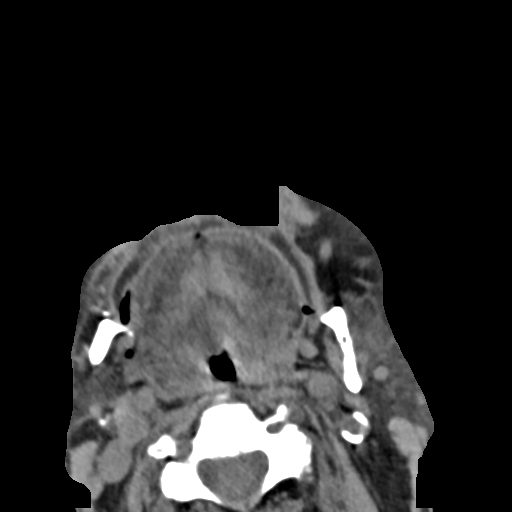
[im 57/85  soft-tissue]
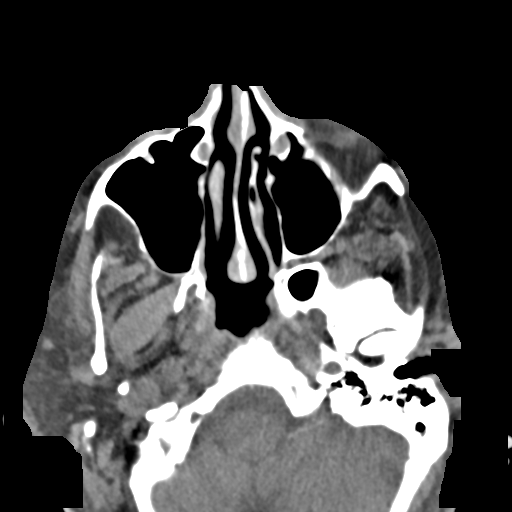

[Series 10: c spine soft · axial · 0.34mm/px · z∈[-169,-109]mm · 2 of 90 slices shown]
[im 30/90  soft-tissue]
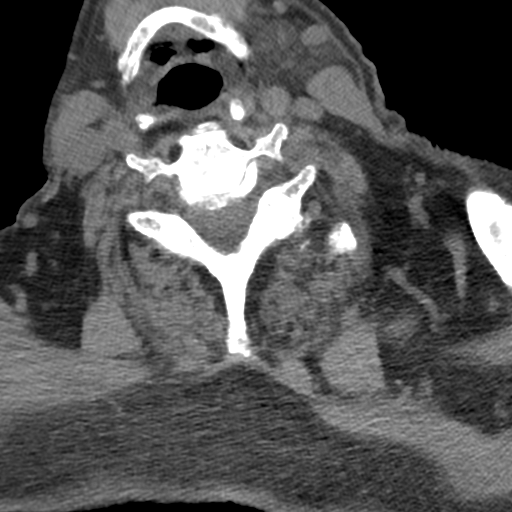
[im 60/90  soft-tissue]
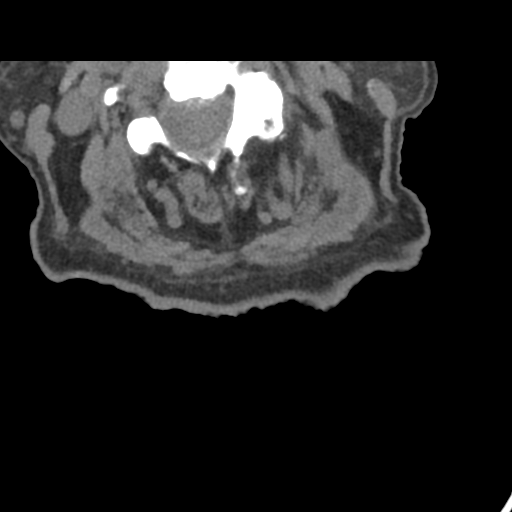

[Series 11: sagittal bone · sagittal · 0.26mm/px · 1 of 47 slices shown]
[im 24/47  bone]
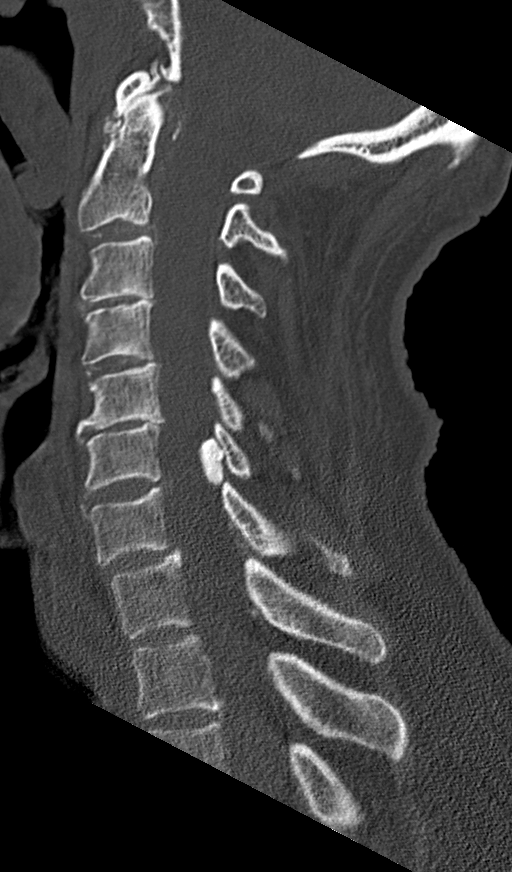

[Series 13: orthogonal axials · axial · 0.22mm/px · z∈[-199,-139]mm · 2 of 102 slices shown, 3 images]
[im 34/102  soft-tissue]
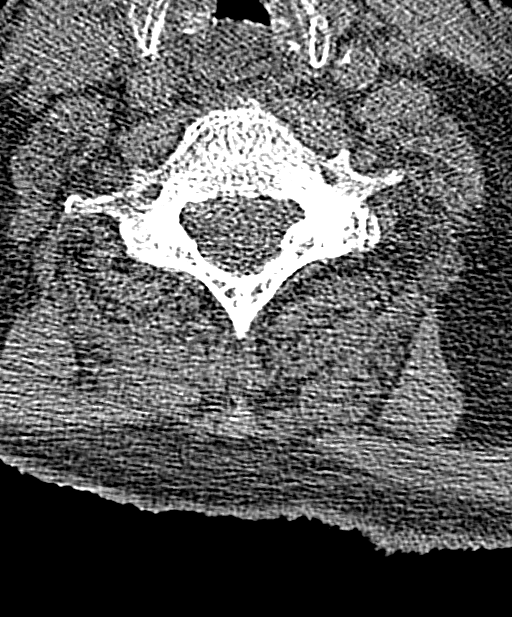
[im 34/102  bone]
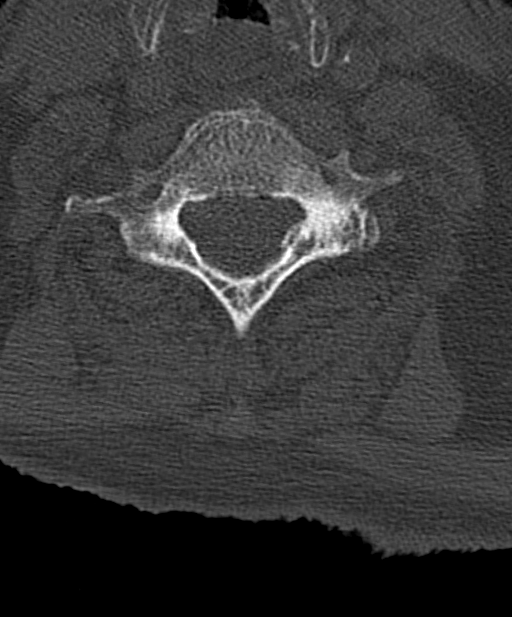
[im 68/102  bone]
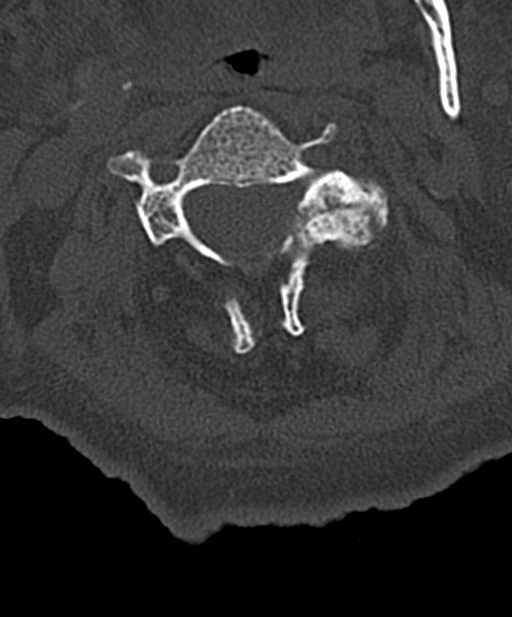

[7 of 34 positions shown; findings below may reference images not displayed]

FINDINGS: CT HEAD FINDINGS

Brain: Acute subdural hemorrhage extending from the frontal
convexity posteriorly over the parietal lobe. Hemorrhage extends
into the middle cranial fossa, subtemporal. The greatest thickness
of the subdural component measures 7 mm at the sylvian fissure.
Hemorrhage within the middle cranial fossa is partly
intraparenchymal of the temporal lobe, extends to the temporal pole,
and posteriorly along the petroclival ligament. Hematoma within the
middle cranial fossa measures 4.3 cm x 2.2 cm. Partial effacement of
the right lateral ventricle.

Basal cisterns patent. Left lateral ventricle patent. Third
ventricle patent though partially effaced. Suprasellar cisterns
patent, with effacement of the right Mackenna cistern secondary to
blood products.

Vascular: Calcifications of the intracranial vasculature.

Skull: No acute fracture.  No aggressive bony lesion.

Other: No scalp hematoma or radiopaque foreign body.

CT MAXILLOFACIAL FINDINGS

Osseous: Edentulous mandible. No fracture identified. Edentulous
maxilla. No fracture identified. No midface fracture. No orbital
fracture.

Orbits: Surgical changes of bilateral lens extraction. Otherwise
unremarkable orbits.

Sinuses: Trace mucosal disease of the ethmoid air cells and frontal
recesses. No mastoid effusion.

Soft tissues: No radiopaque foreign body.  No soft tissue swelling.

CT CERVICAL SPINE FINDINGS

Alignment: Alignment maintained with no subluxation. No significant
anterolisthesis/retrolisthesis.

Skull base and vertebrae: Skull base unremarkable with no acute
fracture. Craniocervical junction maintained. No acute fracture of
the cervical vertebral bodies.

Soft tissues and spinal canal: No focal soft tissue swelling. No
canal hematoma. Carotid calcifications.

Disc levels: Degenerative disc disease throughout the cervical spine
with disc space narrowing, uncovertebral joint disease, and anterior
osteophyte production, which is most pronounced at C5-C6 and C6-C7.

Posterior disc osteophyte complex at the C6-C7 level, which is
associated with posterior calcification, potentially degenerative or
meningioma. This results in narrowing of the canal at this disc
level, approximately 6 mm-7 mm.

Mild left foraminal narrowing at C2-C3 secondary to uncovertebral
joint disease and facet disease.

Advanced foraminal narrowing at C3-C4 on the left secondary to
uncovertebral joint disease and facet disease.

Advanced left foraminal narrowing at C4-C5 and moderate right-sided,
secondary to uncovertebral joint disease and facet disease.

Upper chest: Unremarkable

Other: None
IMPRESSION: Head CT:

Multicompartmental hemorrhage of the right hemisphere, predominantly
subdural component and intraparenchymal hemorrhage of the right
temporal lobe. Holo hemispheric subdural hemorrhage extends from the
frontal region over the temporal and parietal lobe with the greatest
thickness measured at the sylvian fissure, 7 mm. Midline shift at
the foramen of Basaes measuring 9 mm. Hematoma within the middle
cranial fossa continuous with the petroclival ligament measures
cm x 2.2 cm, with associated early uncal herniation.

Preliminary results were called by telephone at the time of
interpretation on 03/06/2018 at [DATE] to Dr. [HOSPITAL] DMITRI ,
who verbally acknowledged these results.

Cervical CT:

No acute fracture or malalignment of the cervical spine.

Multilevel degenerative changes, with foraminal narrowing worst at
the levels of C2-C5 as above.

There is canal narrowing at the C6-C7 level secondary to posterior
disc osteophyte complex and posterior canal calcifications. Canal
measures 6 mm-7 mm at this site.

Maxillofacial CT:

No acute fracture.
